# Patient Record
Sex: Female | Born: 1995 | Race: White | Hispanic: No | Marital: Single | Smoking: Former smoker
Health system: Southern US, Community
[De-identification: ages and names within clinical notes are randomized; demographics above are authoritative.]

## PROBLEM LIST (undated history)

## (undated) DIAGNOSIS — Z8669 Personal history of other diseases of the nervous system and sense organs: Secondary | ICD-10-CM

## (undated) DIAGNOSIS — R519 Headache, unspecified: Secondary | ICD-10-CM

## (undated) DIAGNOSIS — Z8619 Personal history of other infectious and parasitic diseases: Secondary | ICD-10-CM

## (undated) DIAGNOSIS — J101 Influenza due to other identified influenza virus with other respiratory manifestations: Secondary | ICD-10-CM

## (undated) DIAGNOSIS — K219 Gastro-esophageal reflux disease without esophagitis: Secondary | ICD-10-CM

## (undated) DIAGNOSIS — R51 Headache: Secondary | ICD-10-CM

## (undated) DIAGNOSIS — F418 Other specified anxiety disorders: Secondary | ICD-10-CM

## (undated) HISTORY — DX: Headache: R51

## (undated) HISTORY — DX: Personal history of other infectious and parasitic diseases: Z86.19

## (undated) HISTORY — DX: Other specified anxiety disorders: F41.8

## (undated) HISTORY — DX: Personal history of other diseases of the nervous system and sense organs: Z86.69

## (undated) HISTORY — DX: Headache, unspecified: R51.9

## (undated) HISTORY — DX: Gastro-esophageal reflux disease without esophagitis: K21.9

---
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
## 2014-03-26 ENCOUNTER — Telehealth: Payer: Self-pay | Admitting: Family Medicine

## 2014-03-26 NOTE — Telephone Encounter (Signed)
Patient Information:  Caller Name: Dahlia ClientHannah  Phone: [phone]  Patient: Shannon Mcdowell, Shannon Mcdowell  Gender: Female  DOB: 15-Jun-1995  Age: [age] Years  PCP: Eustaquio BoydenGutierrez, Javier [facility])  Pregnant: No  Office Follow Up:  Does the office need to follow up with this patient?: No  Instructions For The Office: N/A   Symptoms  Reason For Call & Symptoms: Pt reports she has had a headache x1 week and getting worse the last week.  Reviewed Health History In EMR: Yes  Reviewed Medications In EMR: Yes  Reviewed Allergies In EMR: Yes  Reviewed Surgeries / Procedures: Yes  Date of Onset of Symptoms: 03/20/2014 OB / GYN:  LMP: 02/25/2014  Guideline(s) Used:  Headache  Sinus Pain and Congestion  Disposition Per Guideline:   See Today or Tomorrow in Office  Reason For Disposition Reached:   Sinus congestion (pressure, fullness) present > 10 days  Advice Given:  Reassurance:   Sinus congestion is a normal part of a cold.  Usually home treatment with nasal washes can prevent an actual bacterial sinus infection.  Antibiotics are not helpful for the sinus congestion that occurs with colds.  Here is some care advice that should help.  For a Stuffy Nose - Use Nasal Washes:  Introduction: Saline (salt water) nasal irrigation (nasal wash) is an effective and simple home remedy for treating stuffy nose and sinus congestion. The nose can be irrigated by pouring, spraying, or squirting salt water into the nose and then letting it run back out.  How it Helps: The salt water rinses out excess mucus, washes out any irritants (dust, allergens) that might be present, and moistens the nasal cavity.  Methods: There are several ways to perform nasal irrigation. You can use a saline nasal spray bottle (available over-the-counter), a rubber ear syringe, a medical syringe without the needle, or a Neti Pot.  Nasal Decongestants for a Very Stuffy or Runny Nose:  If you have a very stuffy nose, nasal decongestant  medicines can shrink the swollen nasal mucosa and allow for easier breathing. If you have a very runny nose, these medicines can reduce the amount of drainage. They may be taken as pills by mouth or as a nasal spray.  Most people do Not need to use these medicines. If your nose feels blocked, you should try using nasal washes first.  Pseudoephedrine (Sudafed) is available OTC in pill form. Typical adult dosage is two 30 mg tablets every 6 hours.  Phenylephrine (Sudafed PE) is available OTC in pill form. Typical adult dosage is two 30 mg tablets every 6 hours.  Pain and Fever Medicines:  For pain or fever relief, take either acetaminophen or ibuprofen.  Call Back If:   Sinus congestion (fullness) lasts longer than 10 days  Fever lasts longer than 3 days  You become worse.  Patient Will Follow Care Advice:  YES  Appointment Scheduled:  03/27/2014 13:15:00 Appointment Scheduled Provider:  Nicki ReaperBaity, Regina

## 2014-03-27 ENCOUNTER — Encounter: Payer: Self-pay | Admitting: Internal Medicine

## 2014-03-27 ENCOUNTER — Ambulatory Visit (INDEPENDENT_AMBULATORY_CARE_PROVIDER_SITE_OTHER): Payer: 59 | Admitting: Internal Medicine

## 2014-03-27 VITALS — BP 110/82 | HR 100 | Temp 98.4°F | Wt 108.0 lb

## 2014-03-27 DIAGNOSIS — G43909 Migraine, unspecified, not intractable, without status migrainosus: Secondary | ICD-10-CM

## 2014-03-27 MED ORDER — KETOROLAC TROMETHAMINE 30 MG/ML IJ SOLN
30.0000 mg | Freq: Once | INTRAMUSCULAR | Status: AC
  Administered 2014-03-27: 30 mg via INTRAMUSCULAR

## 2014-03-27 NOTE — Patient Instructions (Signed)

## 2014-03-27 NOTE — Progress Notes (Signed)
HPI  Pt presents to the clinic today with c/o headache and facial pressure underneath her eyes. She reports this started 1 week ago. It is located in her forehead. The pain does radiate occasionally to the back of her head. She describes it as a stabbing sensation. She has had some sensitivity to light. She denies nausea, vomiting, dizziness or blurred vision. She has tried Ibuprofen, Excedrin migraine and Aleve. She does have a history of migraines and reports that this feels like the migraines she has had in the past.  Review of Systems    Past Medical History  Diagnosis Date  . Depression with anxiety   . Generalized headaches     occasional  . GERD (gastroesophageal reflux disease)   . Hx of migraines   . History of chicken pox     Family History  Problem Relation Age of Onset  . Cancer Paternal Grandfather     leukemia  . Cancer Paternal Grandmother     breast  . Hypertension Father   . CAD Other     mat great grandmother  . Diabetes Maternal Grandfather   . Stroke Neg Hx   . Mental illness Maternal Grandfather     suicide (shot himself)    History   Social History  . Marital Status: Single    Spouse Name: N/A    Number of Children: N/A  . Years of Education: N/A   Occupational History  . Not on file.   Social History Main Topics  . Smoking status: Never Smoker   . Smokeless tobacco: Never Used  . Alcohol Use: No  . Drug Use: No     Comment: h/o MJ  . Sexual Activity:    Partners: Female   Other Topics Concern  . Not on file   Social History Narrative   Caffeine: lots of mountain dew/pepsi   Lives with father alternating with mother.     Older brother.   Occupation: [employer]   Activity: no regular exercise   Diet: good water, fruits/vegetables daily    No Known Allergies   Constitutional: Positive headache. Denies fatigue, fever or abrupt weight changes.  HEENT:  Positive facial pain. Denies eye redness, ear pain, ringing in the ears, wax buildup,  nasal congestion, runny nose, bloody nose and sore throat. Respiratory: Denies cough, difficulty breathing or shortness of breath.  Cardiovascular: Denies chest pain, chest tightness, palpitations or swelling in the hands or feet.   No other specific complaints in a complete review of systems (except as listed in HPI above).  Objective:  BP 110/82 mmHg  Pulse 100  Temp(Src) 98.4 F (36.9 C) (Oral)  Wt 108 lb (48.988 kg)  SpO2 99%  LMP 02/25/2014   General: Appears her stated age, well developed, well nourished in NAD. HEENT: Head: normal shape and size, no sinus tenderness noted; Eyes: sclera white, no icterus, conjunctiva pink, PERRLA and EOMs intact; Ears: Tm's gray and intact, normal light reflex;  Cardiovascular: Tachycardic with normal rhythm. S1,S2 noted.  No murmur, rubs or gallops noted.  Pulmonary/Chest: Normal effort and positive vesicular breath sounds. No respiratory distress. No wheezes, rales or ronchi noted.  Neuro: Alert and oriented. Coordination normal.    Assessment & Plan:   Migraine headache:  Will give Toradol 30 mg IM today- her mom is here to drive her home Advised her not to take Advil, Excedrin and Aleve at the same time as these all have the same main ingredient There may also be a  component of medication overuse headache Try Excedrin migrainei n 8 hours if the toradol does not seem to help  If headache persist or worsen, please follow up with PCP  RTC as needed or if symptoms persist.

## 2014-03-27 NOTE — Addendum Note (Signed)
Addended by: Roena MaladyEVONTENNO, Braidyn Scorsone Y on: 03/27/2014 04:34 PM   Modules accepted: Orders

## 2014-03-27 NOTE — Progress Notes (Signed)
Pre visit review using our clinic review tool, if applicable. No additional management support is needed unless otherwise documented below in the visit note. 

## 2014-04-03 ENCOUNTER — Encounter: Payer: Self-pay | Admitting: Family Medicine

## 2014-04-03 ENCOUNTER — Ambulatory Visit (INDEPENDENT_AMBULATORY_CARE_PROVIDER_SITE_OTHER): Payer: PRIVATE HEALTH INSURANCE | Admitting: Family Medicine

## 2014-04-03 VITALS — BP 110/70 | HR 88 | Temp 98.4°F | Wt 110.2 lb

## 2014-04-03 DIAGNOSIS — G43019 Migraine without aura, intractable, without status migrainosus: Secondary | ICD-10-CM

## 2014-04-03 MED ORDER — AMITRIPTYLINE HCL 25 MG PO TABS: 25.0000 mg | ORAL_TABLET | Freq: Every day | ORAL | Status: DC

## 2014-04-03 MED ORDER — CYCLOBENZAPRINE HCL 5 MG PO TABS: 5.0000 mg | ORAL_TABLET | Freq: Two times a day (BID) | ORAL | Status: DC | PRN

## 2014-04-03 NOTE — Patient Instructions (Signed)
I think you have persistent migraine headache. Neurological exam normal today. Treat migraine with flexeril 5mg  (muscle relaxant) as needed for migraine headache (as needed only), and start daily amitriptyline 25mg  at bedtime to help prevent migraines (daily medicine). Back off aleve, aspirin, advil, excedrin use for now while we try above medications. Update me next week with effect of above medicines.

## 2014-04-03 NOTE — Progress Notes (Signed)
BP 110/70 mmHg  Pulse 88  Temp(Src) 98.4 F (36.9 C) (Oral)  Wt 110 lb 4 oz (50.009 kg)  LMP 02/25/2014   CC: headache  Subjective:    Patient ID: Shannon Mcdowell, female    DOB: 07-20-1995, 18 y.o.   MRN: [id number]  HPI: Shannon GlasgowHannah L Seltzer is a 18 y.o. female presenting on 04/03/2014 for Headache   Known h/o migraines, recently worsening over last 2 weeks. These feel different because they do not improve with advil. Persistently worsening as day progresses, start when she awakens, continue until she goes to sleep. Unilateral but alternating sides. Describes throbbing and stabbing, +photo/phonophobia. No nausea with them. Activity limiting, better when she lays down in a quiet/dark environment. Rare episodes of dizziness and slurring of speech with migraines.   Seen here 03/27/14 with dx migraine headache - treated with Toradol IM 30mg  with no improvement. ?MOH contributing. Also suggested excedrin pill. Taking 3-4 ibuprofen several times a day.   She has tried advil, Armond Hangaleve, excedrin migraine without improvement.  Increase in anxiety because of worsening headaches.   Currently 7/10 pain.  Sleeping 6-8 hrs/day. Appetite good. Has significantly decreased caffeine intake, drinks mainly water now. Stress level stable - goes to school and works full time. 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    Relevant past medical, surgical, family and social history reviewed and updated as indicated.  Allergies and medications reviewed and updated. No current outpatient prescriptions on file prior to visit.   No current facility-administered medications on file prior to visit.   Past Medical History  Diagnosis Date  . Depression with anxiety   . Generalized headaches     occasional  . GERD (gastroesophageal reflux disease)   . Hx of migraines   . History of chicken pox     Review of Systems Per HPI unless specifically indicated above    Objective:    BP 110/70 mmHg   Pulse 88  Temp(Src) 98.4 F (36.9 C) (Oral)  Wt 110 lb 4 oz (50.009 kg)  LMP 02/25/2014  Physical Exam  Constitutional: She appears well-developed and well-nourished. No distress.  NAD  HENT:  Head: Normocephalic and atraumatic.  Mouth/Throat: Oropharynx is clear and moist. No oropharyngeal exudate.  Eyes: Conjunctivae and EOM are normal. Pupils are equal, round, and reactive to light. No scleral icterus.  Neck: Normal range of motion. Neck supple.  Cardiovascular: Normal rate, regular rhythm, normal heart sounds and intact distal pulses.   No murmur heard. Pulmonary/Chest: Effort normal and breath sounds normal. No respiratory distress. She has no wheezes. She has no rales.  Lymphadenopathy:    She has no cervical adenopathy.  Neurological: She has normal strength. No cranial nerve deficit or sensory deficit. She displays a negative Romberg sign. Coordination and gait normal.  CN 2-12 intact Station and gait intact Intact FTN, no pronator drift  Psychiatric: She has a normal mood and affect.  Nursing note and vitals reviewed.      Assessment & Plan:   Problem List Items Addressed This Visit    Migraine headache - Primary    Describes migraine headache that is worsening over last 2 weeks due to unknown reason. ?MOH given significant NSAID use. rec start amitriptyline 25mg  nightly ppx along with flexeril 5mg  prn HA for abortive therapy. Discussed sedation precautions for both meds. Discussed headache diary - handout provided for pt to drop back off to review. Update next week with effect of above treatment. Also discussed  avoiding migraine triggers and how stress/anxiety may play role in making her more susceptible to headaches. Pt agrees with plan.    Relevant Medications      amitriptyline (ELAVIL) tablet      cyclobenzaprine (FLEXERIL) tablet       Follow up plan: Return if symptoms worsen or fail to improve.

## 2014-04-03 NOTE — Progress Notes (Signed)
Pre visit review using our clinic review tool, if applicable. No additional management support is needed unless otherwise documented below in the visit note. 

## 2014-04-03 NOTE — Assessment & Plan Note (Addendum)
Describes migraine headache that is worsening over last 2 weeks due to unknown reason. ?MOH given significant NSAID use. rec start amitriptyline 25mg  nightly ppx along with flexeril 5mg  prn HA for abortive therapy. Discussed sedation precautions for both meds. Discussed headache diary - handout provided for pt to drop back off to review. Update next week with effect of above treatment. Also discussed avoiding migraine triggers and how stress/anxiety may play role in making her more susceptible to headaches. Pt agrees with plan.

## 2014-07-24 ENCOUNTER — Ambulatory Visit (INDEPENDENT_AMBULATORY_CARE_PROVIDER_SITE_OTHER): Payer: Managed Care, Other (non HMO) | Admitting: Family Medicine

## 2014-07-24 ENCOUNTER — Encounter: Payer: Self-pay | Admitting: Family Medicine

## 2014-07-24 VITALS — BP 114/68 | HR 88 | Temp 98.1°F | Ht 64.0 in | Wt 115.2 lb

## 2014-07-24 DIAGNOSIS — Z0000 Encounter for general adult medical examination without abnormal findings: Secondary | ICD-10-CM | POA: Insufficient documentation

## 2014-07-24 DIAGNOSIS — N898 Other specified noninflammatory disorders of vagina: Secondary | ICD-10-CM | POA: Diagnosis not present

## 2014-07-24 DIAGNOSIS — Z113 Encounter for screening for infections with a predominantly sexual mode of transmission: Secondary | ICD-10-CM

## 2014-07-24 DIAGNOSIS — G43019 Migraine without aura, intractable, without status migrainosus: Secondary | ICD-10-CM

## 2014-07-24 NOTE — Patient Instructions (Signed)
Blood work today. Wet prep today. We will call you with results. Return as needed or in 1 year for next physical.  Health Maintenance Adopting a healthy lifestyle and getting preventive care can go a long way to promote health and wellness. Talk with your health care provider about what schedule of regular examinations is right for you. This is a good chance for you to check in with your provider about disease prevention and staying healthy. In between checkups, there are plenty of things you can do on your own. Experts have done a lot of research about which lifestyle changes and preventive measures are most likely to keep you healthy. Ask your health care provider for more information. WEIGHT AND DIET  Eat a healthy diet  Be sure to include plenty of vegetables, fruits, low-fat dairy products, and lean protein.  Do not eat a lot of foods high in solid fats, added sugars, or salt.  Get regular exercise. This is one of the most important things you can do for your health.  Most adults should exercise for at least 150 minutes each week. The exercise should increase your heart rate and make you sweat (moderate-intensity exercise).  Most adults should also do strengthening exercises at least twice a week. This is in addition to the moderate-intensity exercise.  Maintain a healthy weight  Body mass index (BMI) is a measurement that can be used to identify possible weight problems. It estimates body fat based on height and weight. Your health care provider can help determine your BMI and help you achieve or maintain a healthy weight.  For females 1 years of age and older:   A BMI below 18.5 is considered underweight.  A BMI of 18.5 to 24.9 is normal.  A BMI of 25 to 29.9 is considered overweight.  A BMI of 30 and above is considered obese.  Watch levels of cholesterol and blood lipids  You should start having your blood tested for lipids and cholesterol at 20 years of age, then have  this test every 5 years.  You may need to have your cholesterol levels checked more often if:  Your lipid or cholesterol levels are high.  You are older than 20 years of age.  You are at high risk for heart disease.  CANCER SCREENING   Lung Cancer  Lung cancer screening is recommended for adults 11-67 years old who are at high risk for lung cancer because of a history of smoking.  A yearly low-dose CT scan of the lungs is recommended for people who:  Currently smoke.  Have quit within the past 15 years.  Have at least a 30-pack-year history of smoking. A pack year is smoking an average of one pack of cigarettes a day for 1 year.  Yearly screening should continue until it has been 15 years since you quit.  Yearly screening should stop if you develop a health problem that would prevent you from having lung cancer treatment.  Breast Cancer  Practice breast self-awareness. This means understanding how your breasts normally appear and feel.  It also means doing regular breast self-exams. Let your health care provider know about any changes, no matter how small.  If you are in your 20s or 30s, you should have a clinical breast exam (CBE) by a health care provider every 1-3 years as part of a regular health exam.  If you are 87 or older, have a CBE every year. Also consider having a breast X-ray (mammogram) every year.  If you have a family history of breast cancer, talk to your health care provider about genetic screening.  If you are at high risk for breast cancer, talk to your health care provider about having an MRI and a mammogram every year.  Breast cancer gene (BRCA) assessment is recommended for women who have family members with BRCA-related cancers. BRCA-related cancers include:  Breast.  Ovarian.  Tubal.  Peritoneal cancers.  Results of the assessment will determine the need for genetic counseling and BRCA1 and BRCA2 testing. Cervical Cancer Routine pelvic  examinations to screen for cervical cancer are no longer recommended for nonpregnant women who are considered low risk for cancer of the pelvic organs (ovaries, uterus, and vagina) and who do not have symptoms. A pelvic examination may be necessary if you have symptoms including those associated with pelvic infections. Ask your health care provider if a screening pelvic exam is right for you.   The Pap test is the screening test for cervical cancer for women who are considered at risk.  If you had a hysterectomy for a problem that was not cancer or a condition that could lead to cancer, then you no longer need Pap tests.  If you are older than 65 years, and you have had normal Pap tests for the past 10 years, you no longer need to have Pap tests.  If you have had past treatment for cervical cancer or a condition that could lead to cancer, you need Pap tests and screening for cancer for at least 20 years after your treatment.  If you no longer get a Pap test, assess your risk factors if they change (such as having a new sexual partner). This can affect whether you should start being screened again.  Some women have medical problems that increase their chance of getting cervical cancer. If this is the case for you, your health care provider may recommend more frequent screening and Pap tests.  The human papillomavirus (HPV) test is another test that may be used for cervical cancer screening. The HPV test looks for the virus that can cause cell changes in the cervix. The cells collected during the Pap test can be tested for HPV.  The HPV test can be used to screen women 64 years of age and older. Getting tested for HPV can extend the interval between normal Pap tests from three to five years.  An HPV test also should be used to screen women of any age who have unclear Pap test results.  After 20 years of age, women should have HPV testing as often as Pap tests.  Colorectal Cancer  This type of  cancer can be detected and often prevented.  Routine colorectal cancer screening usually begins at 20 years of age and continues through 20 years of age.  Your health care provider may recommend screening at an earlier age if you have risk factors for colon cancer.  Your health care provider may also recommend using home test kits to check for hidden blood in the stool.  A small camera at the end of a tube can be used to examine your colon directly (sigmoidoscopy or colonoscopy). This is done to check for the earliest forms of colorectal cancer.  Routine screening usually begins at age 48.  Direct examination of the colon should be repeated every 5-10 years through 20 years of age. However, you may need to be screened more often if early forms of precancerous polyps or small growths are found. Skin Cancer  Check your skin from head to toe regularly.  Tell your health care provider about any new moles or changes in moles, especially if there is a change in a mole's shape or color.  Also tell your health care provider if you have a mole that is larger than the size of a pencil eraser.  Always use sunscreen. Apply sunscreen liberally and repeatedly throughout the day.  Protect yourself by wearing long sleeves, pants, a wide-brimmed hat, and sunglasses whenever you are outside. HEART DISEASE, DIABETES, AND HIGH BLOOD PRESSURE   Have your blood pressure checked at least every 1-2 years. High blood pressure causes heart disease and increases the risk of stroke.  If you are between 50 years and 48 years old, ask your health care provider if you should take aspirin to prevent strokes.  Have regular diabetes screenings. This involves taking a blood sample to check your fasting blood sugar level.  If you are at a normal weight and have a low risk for diabetes, have this test once every three years after 20 years of age.  If you are overweight and have a high risk for diabetes, consider being  tested at a younger age or more often. PREVENTING INFECTION  Hepatitis B  If you have a higher risk for hepatitis B, you should be screened for this virus. You are considered at high risk for hepatitis B if:  You were born in a country where hepatitis B is common. Ask your health care provider which countries are considered high risk.  Your parents were born in a high-risk country, and you have not been immunized against hepatitis B (hepatitis B vaccine).  You have HIV or AIDS.  You use needles to inject street drugs.  You live with someone who has hepatitis B.  You have had sex with someone who has hepatitis B.  You get hemodialysis treatment.  You take certain medicines for conditions, including cancer, organ transplantation, and autoimmune conditions. Hepatitis C  Blood testing is recommended for:  Everyone born from [birth year] through 1966.  Anyone with known risk factors for hepatitis C. Sexually transmitted infections (STIs)  You should be screened for sexually transmitted infections (STIs) including gonorrhea and chlamydia if:  You are sexually active and are younger than 20 years of age.  You are older than 20 years of age and your health care provider tells you that you are at risk for this type of infection.  Your sexual activity has changed since you were last screened and you are at an increased risk for chlamydia or gonorrhea. Ask your health care provider if you are at risk.  If you do not have HIV, but are at risk, it may be recommended that you take a prescription medicine daily to prevent HIV infection. This is called pre-exposure prophylaxis (PrEP). You are considered at risk if:  You are sexually active and do not regularly use condoms or know the HIV status of your partner(s).  You take drugs by injection.  You are sexually active with a partner who has HIV. Talk with your health care provider about whether you are at high risk of being infected with HIV. If  you choose to begin PrEP, you should first be tested for HIV. You should then be tested every 3 months for as long as you are taking PrEP.  PREGNANCY   If you are premenopausal and you may become pregnant, ask your health care provider about preconception counseling.  If you may become pregnant,  take 400 to 800 micrograms (mcg) of folic acid every day.  If you want to prevent pregnancy, talk to your health care provider about birth control (contraception). OSTEOPOROSIS AND MENOPAUSE   Osteoporosis is a disease in which the bones lose minerals and strength with aging. This can result in serious bone fractures. Your risk for osteoporosis can be identified using a bone density scan.  If you are 10 years of age or older, or if you are at risk for osteoporosis and fractures, ask your health care provider if you should be screened.  Ask your health care provider whether you should take a calcium or vitamin D supplement to lower your risk for osteoporosis.  Menopause may have certain physical symptoms and risks.  Hormone replacement therapy may reduce some of these symptoms and risks. Talk to your health care provider about whether hormone replacement therapy is right for you.  HOME CARE INSTRUCTIONS   Schedule regular health, dental, and eye exams.  Stay current with your immunizations.   Do not use any tobacco products including cigarettes, chewing tobacco, or electronic cigarettes.  If you are pregnant, do not drink alcohol.  If you are breastfeeding, limit how much and how often you drink alcohol.  Limit alcohol intake to no more than 1 drink per day for nonpregnant women. One drink equals 12 ounces of beer, 5 ounces of wine, or 1 ounces of hard liquor.  Do not use street drugs.  Do not share needles.  Ask your health care provider for help if you need support or information about quitting drugs.  Tell your health care provider if you often feel depressed.  Tell your health  care provider if you have ever been abused or do not feel safe at home. Document Released: 11/15/2010 Document Revised: 09/16/2013 Document Reviewed: 04/03/2013 [facility] Patient Information 2015 [place], [place]. This information is not intended to replace advice given to you by your health care provider. Make sure you discuss any questions you have with your health care provider.

## 2014-07-24 NOTE — Assessment & Plan Note (Signed)
Preventative protocols reviewed and updated unless pt declined. Discussed healthy diet and lifestyle.  

## 2014-07-24 NOTE — Progress Notes (Signed)
Pre visit review using our clinic review tool, if applicable. No additional management support is needed unless otherwise documented below in the visit note. 

## 2014-07-24 NOTE — Assessment & Plan Note (Signed)
Wet prep sent today. Anticipate BV based on discharge present today. STD screen today as well per pt request.

## 2014-07-24 NOTE — Assessment & Plan Note (Signed)
Stable on amitriptyline 25mg  nightly and flexeril abortively. Continue.

## 2014-07-24 NOTE — Progress Notes (Signed)
BP 114/68 mmHg  Pulse 88  Temp(Src) 98.1 F (36.7 C) (Oral)  Ht  (1.626 m)  Wt 115 lb 4 oz (52.277 kg)  BMI 19.77 kg/m2  LMP 07/11/2014   CC: CPE  Subjective:    Patient ID: Shannon Mcdowell, female    DOB: 1996/04/14, 19 y.o.   MRN: [id number]  HPI: Shannon Mcdowell is a 19 y.o. female presenting on 07/24/2014 for Annual Exam   Migraine - on amitriptyline  nightly. Occasional breakthrough headaches.   Occasional tremors.  Preventative: Completed HPV series.  Reviewed immunizations in chart.  Thinks Tdap around 2008. Well woman - saw [facility] 2013. Was on birth control, then stopped. Sexually active with women. 3 partners in last year.   Caffeine: lots of mountain dew/pepsi Lives with father alternating with mother. They are separated. Dogs at home.  Has an older brother. Edu: [employer] studying criminal justice Occupation: Airline pilot, Art gallery manager Activity: no regular exercise  Diet: good water, fruits/vegetables daily, significant Dr Shannon Mcdowell and sweet tea.  Relevant past medical, surgical, family and social history reviewed and updated as indicated. Interim medical history since our last visit reviewed. Allergies and medications reviewed and updated. Current Outpatient Prescriptions on File Prior to Visit  Medication Sig  . amitriptyline (ELAVIL) 25 MG tablet Take 1 tablet (25 mg total) by mouth at bedtime. For migraine prevention  . cyclobenzaprine (FLEXERIL) 5 MG tablet Take 1 tablet (5 mg total) by mouth 2 (two) times daily as needed (migraine headache - take at onset).   No current facility-administered medications on file prior to visit.   Past Medical History  Diagnosis Date  . Depression with anxiety   . Generalized headaches     occasional  . GERD (gastroesophageal reflux disease)   . Hx of migraines   . History of chicken pox    No past surgical history on file.   Review of Systems  Constitutional: Negative for fever, chills, activity change,  appetite change, fatigue and unexpected weight change.  HENT: Negative for hearing loss.   Eyes: Negative for visual disturbance.  Respiratory: Negative for cough, chest tightness, shortness of breath and wheezing.   Cardiovascular: Negative for chest pain, palpitations and leg swelling.  Gastrointestinal: Negative for nausea, vomiting, abdominal pain, diarrhea, constipation, blood in stool and abdominal distention.  Genitourinary: Negative for hematuria and difficulty urinating.  Musculoskeletal: Negative for myalgias, arthralgias and neck pain.  Skin: Negative for rash.  Neurological: Negative for dizziness, seizures, syncope and headaches.  Hematological: Negative for adenopathy. Does not bruise/bleed easily.  Psychiatric/Behavioral: Negative for dysphoric mood. The patient is not nervous/anxious.    Per HPI unless specifically indicated above     Objective:    BP 114/68 mmHg  Pulse 88  Temp(Src) 98.1 F (36.7 C) (Oral)  Ht  (1.626 m)  Wt 115 lb 4 oz (52.277 kg)  BMI 19.77 kg/m2  LMP 07/11/2014  Wt Readings from Last 3 Encounters:  07/24/14 115 lb 4 oz (52.277 kg) (25 %*, Z = -0.69)  04/03/14 110 lb 4 oz (50.009 kg) (16 %*, Z = -0.99)  03/27/14 108 lb (48.988 kg) (13 %*, Z = -1.15)   * Growth percentiles are based on CDC 2-20 Years data.    Physical Exam  Constitutional: She is oriented to person, place, and time. She appears well-developed and well-nourished. No distress.  HENT:  Head: Normocephalic and atraumatic.  Right Ear: Hearing, tympanic membrane, external ear and ear canal normal.  Left  Ear: Hearing, tympanic membrane, external ear and ear canal normal.  Nose: Nose normal.  Mouth/Throat: Uvula is midline, oropharynx is clear and moist and mucous membranes are normal. No oropharyngeal exudate, posterior oropharyngeal edema or posterior oropharyngeal erythema.  Eyes: Conjunctivae and EOM are normal. Pupils are equal, round, and reactive to light. No scleral  icterus.  Neck: Normal range of motion. Neck supple. No thyromegaly present.  Cardiovascular: Normal rate, regular rhythm, normal heart sounds and intact distal pulses.   No murmur heard. Pulses:      Radial pulses are 2+ on the right side, and 2+ on the left side.  Pulmonary/Chest: Effort normal and breath sounds normal. No respiratory distress. She has no wheezes. She has no rales.  Abdominal: Soft. Bowel sounds are normal. She exhibits no distension and no mass. There is no tenderness. There is no rebound and no guarding.  Genitourinary: Uterus normal. Pelvic exam was performed with patient supine. There is no rash, tenderness, lesion or injury on the right labia. There is no rash, tenderness, lesion or injury on the left labia. Cervix exhibits no motion tenderness, no discharge and no friability. Right adnexum displays no mass, no tenderness and no fullness. Left adnexum displays no mass, no tenderness and no fullness. Vaginal discharge found.  Vaginal discharge present with odor present  Musculoskeletal: Normal range of motion. She exhibits no edema.  Lymphadenopathy:    She has no cervical adenopathy.  Neurological: She is alert and oriented to person, place, and time.  CN grossly intact, station and gait intact  Skin: Skin is warm and dry. No rash noted.  Psychiatric: She has a normal mood and affect. Her behavior is normal. Judgment and thought content normal.  Nursing note and vitals reviewed.  Results for orders placed or performed in visit on 08/28/13  GC/chlamydia probe amp, urine  Result Value Ref Range   Chlamydia, Swab/Urine, PCR NEGATIVE NEGATIVE   GC Probe Amp, Urine NEGATIVE NEGATIVE  HIV antibody  Result Value Ref Range   HIV 1&2 Ab, 4th Generation NONREACTIVE NONREACTIVE  RPR  Result Value Ref Range   RPR Ser Ql NON REAC NON REAC      Assessment & Plan:   Problem List Items Addressed This Visit    Vaginal discharge    Wet prep sent today. Anticipate BV based on  discharge present today. STD screen today as well per pt request.      Relevant Orders   GC/Chlamydia Probe Amp   WET PREP BY MOLECULAR PROBE   Migraine headache    Stable on amitriptyline 25mg  nightly and flexeril abortively. Continue.      Health maintenance examination - Primary    Preventative protocols reviewed and updated unless pt declined. Discussed healthy diet and lifestyle.       Relevant Orders   Basic metabolic panel    Other Visit Diagnoses    Screen for STD (sexually transmitted disease)        Relevant Orders    RPR    GC/Chlamydia Probe Amp    HIV antibody        Follow up plan: Return as needed, for annual exam, prior fasting for blood work.

## 2014-07-25 ENCOUNTER — Other Ambulatory Visit: Payer: Self-pay | Admitting: Family Medicine

## 2014-07-25 LAB — BASIC METABOLIC PANEL
BUN: 14 mg/dL (ref 6–23)
CALCIUM: 9.9 mg/dL (ref 8.4–10.5)
CO2: 28 mEq/L (ref 19–32)
CREATININE: 0.83 mg/dL (ref 0.40–1.20)
Chloride: 104 mEq/L (ref 96–112)
GFR: 93.36 mL/min (ref >60.00)
Glucose, Bld: 86 mg/dL (ref 70–99)
Potassium: 4.1 mEq/L (ref 3.5–5.1)
Sodium: 138 mEq/L (ref 135–145)

## 2014-07-25 LAB — WET PREP BY MOLECULAR PROBE
Candida species: NEGATIVE
Gardnerella vaginalis: POSITIVE — AB
TRICHOMONAS VAG: POSITIVE — AB

## 2014-07-25 LAB — RPR

## 2014-07-25 LAB — GC/CHLAMYDIA PROBE AMP
CT Probe RNA: NEGATIVE
GC Probe RNA: NEGATIVE

## 2014-07-25 LAB — HIV ANTIBODY (ROUTINE TESTING W REFLEX): HIV 1&2 Ab, 4th Generation: NONREACTIVE

## 2014-07-25 MED ORDER — METRONIDAZOLE 500 MG PO TABS: 500.0000 mg | ORAL_TABLET | Freq: Two times a day (BID) | ORAL | Status: DC

## 2014-07-28 ENCOUNTER — Telehealth: Payer: Self-pay

## 2014-07-28 NOTE — Telephone Encounter (Signed)
Make sure not drinking any EtOH as it will make her sick. Recommend finish abx course. Would have her take in applesauce or other food. This is best med for trichomonas.

## 2014-07-28 NOTE — Telephone Encounter (Signed)
Spoke with patient and confirmed she is not drinking alcohol. She is going to finish abx as she has found that taking them with milk helps.

## 2014-07-28 NOTE — Telephone Encounter (Signed)
Pt left v/m requesting cb; pt request to change abx (flagyl ?) due to taste of abx. Please advise.[facility].

## 2014-08-16 ENCOUNTER — Other Ambulatory Visit: Payer: Self-pay | Admitting: Family Medicine

## 2014-08-27 ENCOUNTER — Telehealth: Payer: Self-pay

## 2014-08-27 DIAGNOSIS — G43019 Migraine without aura, intractable, without status migrainosus: Secondary | ICD-10-CM

## 2014-08-27 DIAGNOSIS — R51 Headache: Secondary | ICD-10-CM

## 2014-08-27 DIAGNOSIS — R519 Headache, unspecified: Secondary | ICD-10-CM

## 2014-08-27 NOTE — Telephone Encounter (Signed)
Pt left v/m; pt was seen 07/24/14 for annual exam; h/a continues to hurt and today pt has blurred vision. Pt request referral to neurologist.Please advise.

## 2014-08-29 NOTE — Telephone Encounter (Signed)
Will refer to headache center for persistent headaches.

## 2014-09-01 ENCOUNTER — Telehealth: Payer: Self-pay | Admitting: Family Medicine

## 2014-09-01 NOTE — Telephone Encounter (Signed)
referral placed last week

## 2014-09-01 NOTE — Telephone Encounter (Signed)
Pt is requesting a referral to a neurologist.  Mom states that shots and medications for headaches are not helping. Call back [phone]

## 2014-09-02 NOTE — Telephone Encounter (Signed)
LVM on home and cell #'s. Also called [phone]; phone just kept ringing.

## 2014-09-24 ENCOUNTER — Telehealth: Payer: Self-pay | Admitting: Family Medicine

## 2014-09-24 NOTE — Telephone Encounter (Signed)
Pt has appt with Nicki Reaperegina Baity NP 09/25/14 at 1:15 PM.

## 2014-09-24 NOTE — Telephone Encounter (Signed)
[facility] Client TELEPHONE ADVICE RECORD [facility]  Patient Name: Shannon Mcdowell ClientHANNAH Behe  DOB: 04-21-1996    Initial Comment Caller states she finished her medication, she now has a discharge, only when she urinates.   Nurse Assessment  Nurse: Roderic [place], RN, Amy Date/Time Lamount Cohen(Eastern Time): 09/24/2014 10:24:14 AM  Confirm and document reason for call. If symptomatic, describe symptoms. ---CALLER STATES THAT SHE WAS TAKING A MEDICATION FOR BACTERIAL VAGINOSIS. SHE HAS FINISHED HER MEDS NOW AND SHE IS ONLY HAVING A DISCHARGE WHEN SHE URINATES. THE DISCHARGE IS WHITE. WHEN SHE IS URINATING SHE IS HAVING WHITE, CHUNKY SECRETIONS COMING OUT. SHE WAS TESTED FOR THIS LAST MONTH. SHE WAS TO TAKE 7 DAYS OF THE MEDICATIONS.  Has the patient traveled out of the country within the last 30 days? ---Not Applicable  Does the patient require triage? ---Yes  Related visit to physician within the last 2 weeks? ---No  Does the PT have any chronic conditions? (i.e. diabetes, asthma, etc.) ---No  Did the patient indicate they were pregnant? ---No     Guidelines    Guideline Title Affirmed Question Affirmed Notes  Vaginal Discharge [1] Mild lower abdominal pain comes and goes (cramps) AND [2] lasts > 24 hours    Final Disposition User   See Physician within 24 Hours [place], RN, Amy    Comments  permission was given to leave appt information on her phone. called and left the information on phone that she has an appt at 1030 on thursday.

## 2014-09-25 ENCOUNTER — Ambulatory Visit: Payer: Self-pay | Admitting: Internal Medicine

## 2014-09-25 ENCOUNTER — Encounter: Payer: Self-pay | Admitting: Internal Medicine

## 2014-09-25 ENCOUNTER — Ambulatory Visit (INDEPENDENT_AMBULATORY_CARE_PROVIDER_SITE_OTHER): Payer: Managed Care, Other (non HMO) | Admitting: Internal Medicine

## 2014-09-25 VITALS — BP 122/78 | HR 85 | Temp 98.0°F | Wt 110.5 lb

## 2014-09-25 DIAGNOSIS — B373 Candidiasis of vulva and vagina: Secondary | ICD-10-CM | POA: Diagnosis not present

## 2014-09-25 DIAGNOSIS — B3731 Acute candidiasis of vulva and vagina: Secondary | ICD-10-CM

## 2014-09-25 MED ORDER — FLUCONAZOLE 150 MG PO TABS: 150.0000 mg | ORAL_TABLET | Freq: Once | ORAL | Status: DC

## 2014-09-25 NOTE — Progress Notes (Signed)
Pre visit review using our clinic review tool, if applicable. No additional management support is needed unless otherwise documented below in the visit note. 

## 2014-09-25 NOTE — Progress Notes (Signed)
Subjective:    Patient ID: Shannon Mcdowell, female    DOB: 23-May-1995, 19 y.o.   MRN: [id number]  HPI  Pt presents to the clinic today with c/o vaginal discharge. She reports she was seen 1 month ago for the same. She was diagnosed with Bacterial Vaginosis and treated with Flagyl x 7 days. She reports her symptoms did improve but did not go away completely. She now only has vaginal discharge with urination. The discharge is thick and white. She denies vaginal itching but the outside burns a little with urination. She has had some mild abdominal cramping but denies nausea or vomiting. She also reports that she has not had a period since 06/25/2014. She is very irregular. She is not on OCP's. She is sexually active but with females only.  Review of Systems      Past Medical History  Diagnosis Date  . Depression with anxiety   . Generalized headaches     occasional  . GERD (gastroesophageal reflux disease)   . Hx of migraines   . History of chicken pox     Current Outpatient Prescriptions  Medication Sig Dispense Refill  . amitriptyline (ELAVIL) 25 MG tablet Take 1 tablet (25 mg total) by mouth at bedtime. For migraine prevention 30 tablet 3  . cyclobenzaprine (FLEXERIL) 5 MG tablet TAKE 1 TABLET BY MOUTH 2 TIMES A DAY AS NEEDED FOR ONSET OF MIGRAINE HEADACHE 30 tablet 1  . metroNIDAZOLE (FLAGYL) 500 MG tablet Take 1 tablet (500 mg total) by mouth 2 (two) times daily. 14 tablet 0   No current facility-administered medications for this visit.    No Known Allergies  Family History  Problem Relation Age of Onset  . Cancer Paternal Grandfather     leukemia  . Cancer Paternal Grandmother     breast  . Hypertension Father   . CAD Other     mat great grandmother  . Diabetes Maternal Grandfather   . Stroke Neg Hx   . Mental illness Maternal Grandfather     suicide (shot himself)    History   Social History  . Marital Status: Single    Spouse Name: N/A  . Number of  Children: N/A  . Years of Education: N/A   Occupational History  . Not on file.   Social History Main Topics  . Smoking status: Never Smoker   . Smokeless tobacco: Never Used  . Alcohol Use: No  . Drug Use: No     Comment: h/o MJ  . Sexual Activity:    Partners: Female   Other Topics Concern  . Not on file   Social History Narrative   Caffeine: lots of mountain dew/pepsi   Lives with father alternating with mother.     Older brother.   Occupation: Airline pilotsales   Activity: no regular exercise   Diet: good water, fruits/vegetables daily     Constitutional: Denies fever, malaise, fatigue, headache or abrupt weight changes.  HEENT: Denies eye pain, eye redness, ear pain, ringing in the ears, wax buildup, runny nose, nasal congestion, bloody nose, or sore throat. Respiratory: Denies difficulty breathing, shortness of breath, cough or sputum production.   Cardiovascular: Denies chest pain, chest tightness, palpitations or swelling in the hands or feet.  Gastrointestinal: Pt reports abdominal pain. Denies abdominal pain, bloating, constipation, diarrhea or blood in the stool.  GU: Pt reports vaginal discharge. Denies urgency, frequency, pain with urination, burning sensation, blood in urine, odor.  No other specific complaints in  a complete review of systems (except as listed in HPI above).   Objective:   Physical Exam   BP 122/78 mmHg  Pulse 85  Temp(Src) 98 F (36.7 C) (Oral)  Wt 110 lb 8 oz (50.122 kg)  SpO2 96% Wt Readings from Last 3 Encounters:  09/25/14 110 lb 8 oz (50.122 kg) (16 %*, Z = -1.01)  07/24/14 115 lb 4 oz (52.277 kg) (25 %*, Z = -0.69)  04/03/14 110 lb 4 oz (50.009 kg) (16 %*, Z = -0.99)   * Growth percentiles are based on CDC 2-20 Years data.    General: Appears her stated age, well developed, well nourished in NAD. Skin: Warm, dry and intact. No rashes, lesions or ulcerations noted. Cardiovascular: Normal rate and rhythm. S1,S2 noted.  No murmur, rubs  or gallops noted.  Pulmonary/Chest: Normal effort and positive vesicular breath sounds. No respiratory distress. No wheezes, rales or ronchi noted.  Abdomen: Soft and nontender. Normal bowel sounds, no bruits noted. No distention or masses noted.  Pelvic: Normal female anatomy. Slight odor noted. White discharge noted at the vaginal opening.  BMET    Component Value Date/Time   NA 138 07/24/2014 1720   NA 139 09/04/2012 2355   K 4.1 07/24/2014 1720   K 3.8 09/04/2012 2355   CL 104 07/24/2014 1720   CL 105 09/04/2012 2355   CO2 28 07/24/2014 1720   CO2 27* 09/04/2012 2355   GLUCOSE 86 07/24/2014 1720   GLUCOSE 88 09/04/2012 2355   BUN 14 07/24/2014 1720   BUN 9 09/04/2012 2355   CREATININE 0.83 07/24/2014 1720   CREATININE 0.64 09/04/2012 2355   CALCIUM 9.9 07/24/2014 1720   CALCIUM 9.2 09/04/2012 2355         Assessment & Plan:   Candida Vaginitis:  Wet prep: + yeast, no clue cells eRx for Diflucan 150 mg PO x 1  RTC as needed or if symptoms persist or worsen

## 2014-09-25 NOTE — Patient Instructions (Signed)

## 2014-09-30 ENCOUNTER — Encounter: Payer: Self-pay | Admitting: Neurology

## 2014-09-30 ENCOUNTER — Ambulatory Visit (INDEPENDENT_AMBULATORY_CARE_PROVIDER_SITE_OTHER): Payer: Managed Care, Other (non HMO) | Admitting: Neurology

## 2014-09-30 VITALS — BP 102/78 | HR 88 | Resp 20 | Ht 64.0 in | Wt 114.4 lb

## 2014-09-30 DIAGNOSIS — R251 Tremor, unspecified: Secondary | ICD-10-CM | POA: Diagnosis not present

## 2014-09-30 DIAGNOSIS — F4323 Adjustment disorder with mixed anxiety and depressed mood: Secondary | ICD-10-CM | POA: Diagnosis not present

## 2014-09-30 DIAGNOSIS — G43719 Chronic migraine without aura, intractable, without status migrainosus: Secondary | ICD-10-CM

## 2014-09-30 DIAGNOSIS — F419 Anxiety disorder, unspecified: Secondary | ICD-10-CM

## 2014-09-30 DIAGNOSIS — Z720 Tobacco use: Secondary | ICD-10-CM

## 2014-09-30 DIAGNOSIS — H538 Other visual disturbances: Secondary | ICD-10-CM

## 2014-09-30 DIAGNOSIS — Z87891 Personal history of nicotine dependence: Secondary | ICD-10-CM | POA: Insufficient documentation

## 2014-09-30 MED ORDER — AMITRIPTYLINE HCL 50 MG PO TABS: ORAL_TABLET | ORAL | Status: DC

## 2014-09-30 MED ORDER — SUMATRIPTAN SUCCINATE 100 MG PO TABS
ORAL_TABLET | ORAL | Status: DC
Start: 2014-09-30 — End: 2014-12-25

## 2014-09-30 NOTE — Patient Instructions (Addendum)
1.  Take amitriptyline 50mg  tablet.  Take 1/2 tablet at bedtime every night for 7 days, then increase to 1 full tablet at bedtime.  After starting 1 full tablet, call in 4 weeks with update and we can increase dose if needed. 2.  At earliest onset of headache, take sumatriptan 100mg  tablet.  May repeat a pill once in 2 hours if needed.  Do not exceed 2 tablets in 24 hours.  Do not take more than 2 days in a week 3.  MRI of brain with and without contrast. 4.  Follow up in 3 months.

## 2014-09-30 NOTE — Progress Notes (Signed)
NEUROLOGY CONSULTATION NOTE  Shannon Mcdowell MRN: [id number] DOB: August 23, 1995  Referring provider: Dr. Sharen HonesGutierrez Primary care provider: Dr. Sharen HonesGutierrez  Reason for consult:  headache  HISTORY OF PRESENT ILLNESS: Shannon Mcdowell is a 19 year old right-handed female with anxiety who presents for migraines.  Records reviewed.  She is accompanied by a friend who provides some history.  Onset:  Has had headaches on and off for several years.  She has had a new chronic daily headache since November 2015.  There was no preceding trigger. Location:  Variable (either side or whole head) Quality:  Feels like "somethin eating my brain" Intensity:  "11/10" Aura:  no Prodrome:  no Associated symptoms:  Photophobia, phonophobia, nausea.  More recently, she has had blurred vision.  She also has had increase in anxiety and one time noted episode of lightheadedness with slurred speech.  She notes problems with memory. Duration:  constant Frequency:  constant Triggers/exacerbating factors:  alcohol Relieving factors:  none Activity:  She is able to function  Past abortive therapy:  Advil, Aleve, Excedrin Migraine Past preventative therapy:  none  Current abortive therapy:  Cyclobenzaprine 10mg  (variable efficacy) Current preventative therapy:  amitriptyline 25mg  (effective for 2 months but then stopped so she hasn't been taking it consistency )  Caffeine:  Dr. Reino KentPepper daily Alcohol:  occasionally Smoker:  socially Diet:  Doesn't drink enough water Exercise:  Not routine Depression/stress:  Significant for anxiety Sleep hygiene:  [employer] in [employer] department and goes to school Family history of headache:  No.  No family history of aneurysms.  She also notes tremor in her hands.  PAST MEDICAL HISTORY: Past Medical History  Diagnosis Date  . Depression with anxiety   . Generalized headaches     occasional  . GERD (gastroesophageal reflux disease)   . Hx of migraines   . History of  chicken pox     PAST SURGICAL HISTORY: No past surgical history on file.  MEDICATIONS: Current Outpatient Prescriptions on File Prior to Visit  Medication Sig Dispense Refill  . cyclobenzaprine (FLEXERIL) 5 MG tablet TAKE 1 TABLET BY MOUTH 2 TIMES A DAY AS NEEDED FOR ONSET OF MIGRAINE HEADACHE 30 tablet 1  . fluconazole (DIFLUCAN) 150 MG tablet Take 1 tablet (150 mg total) by mouth once. (Patient not taking: Reported on 09/30/2014) 1 tablet 0   No current facility-administered medications on file prior to visit.    ALLERGIES: No Known Allergies  FAMILY HISTORY: Family History  Problem Relation Age of Onset  . Cancer Paternal Grandfather     leukemia  . Cancer Paternal Grandmother     breast& lung  . Hypertension Father   . CAD Other     mat great grandmother  . Diabetes Maternal Grandfather   . Stroke Neg Hx   . Mental illness Maternal Grandfather     suicide (shot himself)  . Anxiety disorder Brother     SOCIAL HISTORY: History   Social History  . Marital Status: Single    Spouse Name: N/A  . Number of Children: N/A  . Years of Education: N/A   Occupational History  . Not on file.   Social History Main Topics  . Smoking status: Former Games developermoker  . Smokeless tobacco: Never Used  . Alcohol Use: 0.0 oz/week    0 Standard drinks or equivalent per week     Comment: social   . Drug Use: Yes     Comment: h/o MJ  . Sexual Activity:  Partners: Female   Other Topics Concern  . Not on file   Social History Narrative   Caffeine: lots of mountain dew/pepsi   Lives with father alternating with mother.     Older brother.   Occupation: Airline pilotsales   Activity: no regular exercise   Diet: good water, fruits/vegetables daily    REVIEW OF SYSTEMS: Constitutional: No fevers, chills, or sweats, no generalized fatigue, change in appetite Eyes: No visual changes, double vision, eye pain Ear, nose and throat: No hearing loss, ear pain, nasal congestion, sore  throat Cardiovascular: No chest pain, palpitations Respiratory:  No shortness of breath at rest or with exertion, wheezes GastrointestinaI: No nausea, vomiting, diarrhea, abdominal pain, fecal incontinence Genitourinary:  No dysuria, urinary retention or frequency Musculoskeletal:  No neck pain, back pain Integumentary: No rash, pruritus, skin lesions Neurological: as above Psychiatric: anxiety Endocrine: No palpitations, fatigue, diaphoresis, mood swings, change in appetite, change in weight, increased thirst Hematologic/Lymphatic:  No anemia, purpura, petechiae. Allergic/Immunologic: no itchy/runny eyes, nasal congestion, recent allergic reactions, rashes  PHYSICAL EXAM: Filed Vitals:   09/30/14 0954  BP: 102/78  Pulse: 88  Resp: 20   General: No acute distress Head:  Normocephalic/atraumatic Eyes:  fundi unremarkable, without vessel changes, exudates, hemorrhages or papilledema. Neck: supple, no paraspinal tenderness, full range of motion Back: No paraspinal tenderness Heart: regular rate and rhythm Lungs: Clear to auscultation bilaterally. Vascular: No carotid bruits. Neurological Exam: Mental status: alert and oriented to person, place, and time, recent and remote memory intact, fund of knowledge intact, attention and concentration intact, speech fluent and not dysarthric, language intact. Cranial nerves: CN I: not tested CN II: pupils equal, round and reactive to light, visual fields intact, fundi unremarkable, without vessel changes, exudates, hemorrhages or papilledema. CN III, IV, VI:  full range of motion, no nystagmus, no ptosis CN V: facial sensation intact CN VII: upper and lower face symmetric CN VIII: hearing intact CN IX, X: gag intact, uvula midline CN XI: sternocleidomastoid and trapezius muscles intact CN XII: tongue midline Bulk & Tone: normal, no fasciculations. Motor:  5/5 throughout.  Very fine tremor in both hands which resolve when  distracted. Sensation:  Temperature and vibration intact. Deep Tendon Reflexes:  2+ throughout, toes downgoing Finger to nose testing:  No dysmetria Heel to shin:  On dysmetria Gait:  Normal station and stride.  Able to turn and walk in tandem. Romberg negative.  IMPRESSION: Chronic migraine without aura, intractable Blurred vision Tremor, likely related to anxiety.  Tremor resolves when distracted. Anxiety Tobacco use  PLAN: 1.  Restart amitriptyline 25mg  at bedtime nightly for 1 week, then increase to 50mg  at bedtime.  Call in 4 weeks with update and we can adjust dose further if needed. 2.  Sumatriptan 100mg  for abortive therapy 3.  Since this is a new constant headache for several months, as well as experiencing blurred vision, will get MRI of brain with and without contrast. 4.  Smoking cessation 5.  Follow up in 3 months 6.  She is having an eye exam on Thursday.  Asked that she have findings/note sent to us.  Thank you for allowing me to take part in the care of this patient.  Shon MilletAdam Madolyn Ackroyd, DO  CC:  Eustaquio BoydenJavier Gutierrez, MD

## 2014-10-02 ENCOUNTER — Telehealth: Payer: Self-pay | Admitting: *Deleted

## 2014-10-02 NOTE — Telephone Encounter (Signed)
Per Dondra SpryGail at [facility] no pre certification is required for this test with this health plan

## 2014-10-03 ENCOUNTER — Telehealth: Payer: Self-pay | Admitting: Neurology

## 2014-10-03 ENCOUNTER — Telehealth: Payer: Self-pay | Admitting: Family Medicine

## 2014-10-03 ENCOUNTER — Ambulatory Visit
Admission: RE | Admit: 2014-10-03 | Discharge: 2014-10-03 | Disposition: A | Discharge status: Home / Self Care | Payer: 59 | Source: Ambulatory Visit | Attending: Neurology | Admitting: Neurology

## 2014-10-03 DIAGNOSIS — G43719 Chronic migraine without aura, intractable, without status migrainosus: Secondary | ICD-10-CM

## 2014-10-03 DIAGNOSIS — R251 Tremor, unspecified: Secondary | ICD-10-CM

## 2014-10-03 DIAGNOSIS — F419 Anxiety disorder, unspecified: Secondary | ICD-10-CM

## 2014-10-03 MED ORDER — GADOBENATE DIMEGLUMINE 529 MG/ML IV SOLN: 10.0000 mL | Freq: Once | INTRAVENOUS | Status: AC | PRN

## 2014-10-03 NOTE — Telephone Encounter (Signed)
Patient was notified of results.  

## 2014-10-03 NOTE — Telephone Encounter (Signed)
-----   Message from Drema DallasAdam R Jaffe, DO sent at 10/03/2014  3:38 PM EDT ----- Please let patient  Know that the MRI of the brain looks unremarkable.  There are two very tiny spots on the brain which look benign and can be seen in people with migraine.

## 2014-10-03 NOTE — Telephone Encounter (Signed)
I did notify her of her MRI results. She wanted to make you aware of the outcome of her eye doctor appt.

## 2014-10-03 NOTE — Telephone Encounter (Signed)
Please review

## 2014-10-03 NOTE — Telephone Encounter (Signed)
Shannon Mcdowell ClientHannah called and wanted to know if her MRI results were ready and also to let Dr Everlena CooperJaffe know that she went for her eye doctors appointment and she does not need glasses, please [phone]/Dawn

## 2014-10-09 ENCOUNTER — Ambulatory Visit: Payer: 59 | Admitting: Neurology

## 2014-12-05 ENCOUNTER — Ambulatory Visit: Payer: 59 | Admitting: Neurology

## 2014-12-25 ENCOUNTER — Encounter: Payer: Self-pay | Admitting: Internal Medicine

## 2014-12-25 ENCOUNTER — Other Ambulatory Visit: Payer: Self-pay | Admitting: Internal Medicine

## 2014-12-25 ENCOUNTER — Ambulatory Visit (INDEPENDENT_AMBULATORY_CARE_PROVIDER_SITE_OTHER): Payer: 59 | Admitting: Internal Medicine

## 2014-12-25 VITALS — BP 106/70 | HR 83 | Temp 98.3°F | Wt 108.0 lb

## 2014-12-25 DIAGNOSIS — B373 Candidiasis of vulva and vagina: Secondary | ICD-10-CM | POA: Diagnosis not present

## 2014-12-25 DIAGNOSIS — B379 Candidiasis, unspecified: Secondary | ICD-10-CM

## 2014-12-25 DIAGNOSIS — Z1159 Encounter for screening for other viral diseases: Secondary | ICD-10-CM

## 2014-12-25 DIAGNOSIS — N390 Urinary tract infection, site not specified: Secondary | ICD-10-CM

## 2014-12-25 DIAGNOSIS — T3695XA Adverse effect of unspecified systemic antibiotic, initial encounter: Secondary | ICD-10-CM

## 2014-12-25 LAB — POCT URINALYSIS DIPSTICK
Bilirubin, UA: NEGATIVE
Glucose, UA: NEGATIVE
KETONES UA: NEGATIVE
NITRITE UA: NEGATIVE
SPEC GRAV UA: 1.02
Urobilinogen, UA: NEGATIVE
pH, UA: 7

## 2014-12-25 MED ORDER — FLUCONAZOLE 150 MG PO TABS: 150.0000 mg | ORAL_TABLET | Freq: Once | ORAL | Status: DC

## 2014-12-25 MED ORDER — CIPROFLOXACIN HCL 500 MG PO TABS: 500.0000 mg | ORAL_TABLET | Freq: Two times a day (BID) | ORAL | Status: DC

## 2014-12-25 NOTE — Progress Notes (Signed)
Pre visit review using our clinic review tool, if applicable. No additional management support is needed unless otherwise documented below in the visit note. 

## 2014-12-25 NOTE — Progress Notes (Signed)
Subjective:    Patient ID: Shannon Mcdowell, female    DOB: 1995-10-24, 19 y.o.   MRN: [id number]  HPI  Pt presents to the clinic today with c/o abdominal pain or pressure. This started 2 days ago. She describes the pain as crampy. She has had some increased urinary frequency and urgency but denies dysuria. Her bowels are moving normal. She has had some nausea but denies vomiting or blood in her stool. She denies fever, chills or body aches. She has not tried anything OTC. Her LMP was 11/17/14, her periods are irregular. She denies possibility of pregnancy, she reports that she is sexually active with women only.  Of note, she wants to be screened for Hep C. Her last sexual contact had Hep C. She has never used IV drugs.  Review of Systems      Past Medical History  Diagnosis Date  . Depression with anxiety   . Generalized headaches     occasional  . GERD (gastroesophageal reflux disease)   . Hx of migraines   . History of chicken pox     No current outpatient prescriptions on file.   No current facility-administered medications for this visit.    No Known Allergies  Family History  Problem Relation Age of Onset  . Cancer Paternal Grandfather     leukemia  . Cancer Paternal Grandmother     breast& lung  . Hypertension Father   . CAD Other     mat great grandmother  . Diabetes Maternal Grandfather   . Stroke Neg Hx   . Mental illness Maternal Grandfather     suicide (shot himself)  . Anxiety disorder Brother     Social History   Social History  . Marital Status: Single    Spouse Name: N/A  . Number of Children: N/A  . Years of Education: N/A   Occupational History  . Not on file.   Social History Main Topics  . Smoking status: Former Games developer  . Smokeless tobacco: Never Used  . Alcohol Use: 0.0 oz/week    0 Standard drinks or equivalent per week     Comment: social   . Drug Use: Yes     Comment: h/o MJ  . Sexual Activity:    Partners: Female   Other  Topics Concern  . Not on file   Social History Narrative   Caffeine: lots of mountain dew/pepsi   Lives with father alternating with mother.     Older brother.   Occupation: Airline pilot   Activity: no regular exercise   Diet: good water, fruits/vegetables daily     Constitutional: Denies fever, malaise, fatigue, headache or abrupt weight changes.  Respiratory: Denies difficulty breathing, shortness of breath, cough or sputum production.   Cardiovascular: Denies chest pain, chest tightness, palpitations or swelling in the hands or feet.  Gastrointestinal: Pt reports abdominal pain. Denies bloating, constipation, diarrhea or blood in the stool.  GU: Pt reports urgency, and frequency. Denies pain with urination, burning sensation, blood in urine, odor or discharge.  No other specific complaints in a complete review of systems (except as listed in HPI above).  Objective:   Physical Exam   BP 106/70 mmHg  Pulse 83  Temp(Src) 98.3 F (36.8 C) (Oral)  Wt 108 lb (48.988 kg)  SpO2 98%  LMP 11/17/2014 Wt Readings from Last 3 Encounters:  12/25/14 108 lb (48.988 kg)  10/03/14 114 lb (51.71 kg) (22 %*, Z = -0.78)  09/30/14 114 lb 6.4  oz (51.891 kg) (23 %*, Z = -0.75)   * Growth percentiles are based on CDC 2-20 Years data.    General: Appears her stated age, well developed, well nourished in NAD. Skin: Warm, dry and intact. No rashes, lesions or ulcerations noted. Cardiovascular: Normal rate and rhythm. S1,S2 noted.  No murmur, rubs or gallops noted.  Pulmonary/Chest: Normal effort and positive vesicular breath sounds. No respiratory distress. No wheezes, rales or ronchi noted.  Abdomen: Soft and tender to palpation over the bladder. Normal bowel sounds, no bruits noted. No distention or masses noted. No CVA tenderness noted.  BMET    Component Value Date/Time   NA 138 07/24/2014 1720   NA 139 09/04/2012 2355   K 4.1 07/24/2014 1720   K 3.8 09/04/2012 2355   CL 104 07/24/2014 1720     CL 105 09/04/2012 2355   CO2 28 07/24/2014 1720   CO2 27* 09/04/2012 2355   GLUCOSE 86 07/24/2014 1720   GLUCOSE 88 09/04/2012 2355   BUN 14 07/24/2014 1720   BUN 9 09/04/2012 2355   CREATININE 0.83 07/24/2014 1720   CREATININE 0.64 09/04/2012 2355   CALCIUM 9.9 07/24/2014 1720   CALCIUM 9.2 09/04/2012 2355        Assessment & Plan:   Urgency, frequency and abdominal pain:  Urinalysis: 3+ leuks, 3+ blood She did not give enough urine to send a urine culture eRx for Cipro 500 mg BID x 5 days eRx for Diflucan for antibiotic induced yeast infection She is not interested in STD testing at this time Drink plenty of fluids  Screening for Hep C:  Hep C antibody with reflex today  RTC as needed or if symptoms persist or worsen

## 2014-12-25 NOTE — Patient Instructions (Signed)

## 2014-12-26 LAB — HEPATITIS C ANTIBODY: HCV Ab: NEGATIVE

## 2015-01-02 ENCOUNTER — Telehealth (INDEPENDENT_AMBULATORY_CARE_PROVIDER_SITE_OTHER): Payer: 59

## 2015-01-02 DIAGNOSIS — R319 Hematuria, unspecified: Secondary | ICD-10-CM

## 2015-01-02 DIAGNOSIS — N390 Urinary tract infection, site not specified: Secondary | ICD-10-CM | POA: Diagnosis not present

## 2015-01-02 NOTE — Telephone Encounter (Signed)
Spoke with patient and she will come in and drop off UA next week with the understanding that if she starts feeling worse over the weekend to go to [facility]. She said she doesn't feel bad, she just knows UTI isn't completely gone.

## 2015-01-02 NOTE — Telephone Encounter (Signed)
Pt was seen by Nicki Reaper NP on 12/25/14; has finished Cipro and diflucan. Pt did not void enough at appt for C/S per office note. Now pt still has mid upper abd pain and urine appears cloudy. Pt is not drinking very much water.  No burning or pain upon urination,no frequency, no back pain or fever. No unusual vaginal discharge and no vaginal itching. Pt request cb. [facility].

## 2015-01-02 NOTE — Telephone Encounter (Signed)
plz come in to drop off urine for repeat urinalysis and if abnormal send for micro/culture.

## 2015-01-08 LAB — POCT URINALYSIS DIPSTICK
Bilirubin, UA: NEGATIVE
Blood, UA: NEGATIVE
Glucose, UA: NEGATIVE
KETONES UA: NEGATIVE
LEUKOCYTES UA: NEGATIVE
NITRITE UA: NEGATIVE
PH UA: 6
Protein, UA: NEGATIVE
Spec Grav, UA: >=1.03
Urobilinogen, UA: 1

## 2015-01-08 NOTE — Telephone Encounter (Signed)
Patient dropped off UA. Results in chart and drawn for culture if desired.

## 2015-01-08 NOTE — Addendum Note (Signed)
Addended by: Josph Macho A on: 01/08/2015 07:26 PM   Modules accepted: Orders

## 2015-01-22 ENCOUNTER — Ambulatory Visit: Payer: Managed Care, Other (non HMO) | Admitting: Neurology

## 2015-02-16 ENCOUNTER — Telehealth: Payer: Self-pay | Admitting: Family Medicine

## 2015-02-16 NOTE — Telephone Encounter (Signed)
Patient Name: Shannon Mcdowell DOB: 02/05/1996 Initial Comment caller states she fell and hit her head night before last, she is having nausea, neck and head pain with back soreness Nurse Assessment Nurse: Charna Elizabeth, RN, Lynden Ang Date/Time (Eastern Time): 02/16/2015 11:36:18 AM Confirm and document reason for call. If symptomatic, describe symptoms. ---Caller states that she hit her head two nights ago when she fell while playing basketball on a hardwood floor. She continues to have head, neck and back pain. No cuts to the skin. Alert and responsive. Has the patient traveled out of the country within the last 30 days? ---No Does the patient require triage? ---Yes Related visit to physician within the last 2 weeks? ---Yes Does the PT have any chronic conditions? (i.e. diabetes, asthma, etc.) ---Yes List chronic conditions. ---Anxiety Did the patient indicate they were pregnant? ---No Guidelines Guideline Title Affirmed Question Affirmed Notes Head Injury [1] ACUTE NEURO SYMPTOM AND [2] now fine (DEFINITION: difficult to awaken OR confused thinking and talking OR slurred speech OR weakness of arms OR unsteady walking) Neck Injury [1] Dangerous mechanism of injury (e.g., MVA, contact sports, diving, fall on trampoline, fall > 10 feet or 3 meters) AND [2] neck pain or stiffness began > 1 hour after injury Final Disposition User Go to ED Now Charna Elizabeth, RN, [employer] Referrals [facility] - ED Disagree/Comply: Comply

## 2015-02-16 NOTE — Telephone Encounter (Signed)
plz call tomorrow for an update. 

## 2015-02-17 NOTE — Telephone Encounter (Signed)
Spoke with patient and she said she is feeling better. No more nausea, neck or back pain. She wasn't actually playing basketball. She was trying to sit on a "high" chair and stood on a basketball as a stepstool and it rolled out from underneath of her causing her to fall.

## 2015-02-19 ENCOUNTER — Ambulatory Visit (INDEPENDENT_AMBULATORY_CARE_PROVIDER_SITE_OTHER): Payer: 59 | Admitting: Family Medicine

## 2015-02-19 ENCOUNTER — Encounter: Payer: Self-pay | Admitting: Family Medicine

## 2015-02-19 VITALS — BP 98/92 | HR 88 | Temp 98.0°F | Wt 108.8 lb

## 2015-02-19 DIAGNOSIS — N898 Other specified noninflammatory disorders of vagina: Secondary | ICD-10-CM

## 2015-02-19 DIAGNOSIS — F4323 Adjustment disorder with mixed anxiety and depressed mood: Secondary | ICD-10-CM

## 2015-02-19 DIAGNOSIS — G43019 Migraine without aura, intractable, without status migrainosus: Secondary | ICD-10-CM

## 2015-02-19 MED ORDER — HYDROXYZINE HCL 10 MG PO TABS: 5.0000 mg | ORAL_TABLET | Freq: Two times a day (BID) | ORAL | Status: DC | PRN

## 2015-02-19 NOTE — Assessment & Plan Note (Addendum)
Wet prep sent today. Await prep prior to starting treatment.  H/o trich x2, BV x1, yeast x1 over last year. Discussed starting yogurt or probiotic daily.

## 2015-02-19 NOTE — Progress Notes (Signed)
Pre visit review using our clinic review tool, if applicable. No additional management support is needed unless otherwise documented below in the visit note. 

## 2015-02-19 NOTE — Assessment & Plan Note (Signed)
Off ppx med and pt denies significant migraine concerns. Discussed MRI results. Will update [place] if sxs recur.

## 2015-02-19 NOTE — Assessment & Plan Note (Signed)
Will trial hydroxyzine 5-10mg  prn anxiety. Discussed sedation precautions. Prior on klonopin, celexa.

## 2015-02-19 NOTE — Patient Instructions (Addendum)
Start yogurt or probiotic daily.  Try hydroxyzine 5-10mg  as needed for anxiety (Sent to pharmacy) We have sent wet prep and will call you with results. MRI brain printed out for you - spots on brain are looking ok.

## 2015-02-19 NOTE — Progress Notes (Signed)
   BP 98/92 mmHg  Pulse 88  Temp(Src) 98 F (36.7 C) (Oral)  Wt 108 lb 12 oz (49.329 kg)  LMP 02/04/2015   CC: vag discharge   Subjective:    Patient ID: Shannon Mcdowell, female    DOB: 1996-04-04, 19 y.o.   MRN: [id number]  HPI: Shannon Mcdowell is a 19 y.o. female presenting on 02/19/2015 for Vaginal Discharge   New vaginal discharge over last 1-[redacted] wks along with lower abd pain - describes thick clumpy discharge.  Started using disposable wipes. No douching, no bubble baths, no hot tubs.  Not currently sexually active. No dysuria, urgency, frequency, flank pain, fevers/ nausea.  09/2014 - candidal vaginitis 07/2014 - BV, trichomonas 05/2013 - trichomonas Last STD screen 07/2104 WNL.  Fall on hardwood floor - hit right posterior head. Initially some R neck pain but that has resolved.  Migraines - prior on amitriptyline. Saw neurologist with normal MRI. Stopped amitriptyline 2/2 nausea. States not too bothersome even off ppx med.   Mood - no recent SI, cutting. Does notice increased anxiety recently, asks about OTC remedy for this vs prn med.  Relevant past medical, surgical, family and social history reviewed and updated as indicated. Interim medical history since our last visit reviewed. Allergies and medications reviewed and updated. No current outpatient prescriptions on file prior to visit.   No current facility-administered medications on file prior to visit.    Review of Systems Per HPI unless specifically indicated above     Objective:    BP 98/92 mmHg  Pulse 88  Temp(Src) 98 F (36.7 C) (Oral)  Wt 108 lb 12 oz (49.329 kg)  LMP 02/04/2015  Wt Readings from Last 3 Encounters:  02/19/15 108 lb 12 oz (49.329 kg)  12/25/14 108 lb (48.988 kg)  10/03/14 114 lb (51.71 kg) (22 %*, Z = -0.78)   * Growth percentiles are based on CDC 2-20 Years data.    Physical Exam  Constitutional: She appears well-developed and well-nourished. No distress.  Genitourinary: Uterus  normal. Pelvic exam was performed with patient supine. There is no rash, tenderness or lesion on the right labia. There is no rash, tenderness or lesion on the left labia. Cervix exhibits no motion tenderness and no friability. Right adnexum displays no mass, no tenderness and no fullness. Left adnexum displays no mass, no tenderness and no fullness. No tenderness in the vagina. No signs of injury around the vagina. Vaginal discharge found.  Thin white discharge present  Psychiatric: She has a normal mood and affect.  Nursing note and vitals reviewed.      Assessment & Plan:   Problem List Items Addressed This Visit    Vaginal discharge - Primary    Wet prep sent today. Await prep prior to starting treatment.  H/o trich x2, BV x1, yeast x1 over last year. Discussed starting yogurt or probiotic daily.      Migraine headache    Off ppx med and pt denies significant migraine concerns. Discussed MRI results. Will update [place] if sxs recur.       Adjustment disorder with mixed anxiety and depressed mood    Will trial hydroxyzine 5-10mg  prn anxiety. Discussed sedation precautions. Prior on klonopin, celexa.           Follow up plan: Return if symptoms worsen or fail to improve.

## 2015-02-19 NOTE — Addendum Note (Signed)
Addended by: Alvina Chou on: 02/19/2015 03:56 PM   Modules accepted: Orders

## 2015-02-20 ENCOUNTER — Other Ambulatory Visit: Payer: Self-pay | Admitting: Family Medicine

## 2015-02-20 LAB — WET PREP BY MOLECULAR PROBE
Candida species: NEGATIVE
Gardnerella vaginalis: POSITIVE — AB
Trichomonas vaginosis: NEGATIVE

## 2015-02-20 MED ORDER — METRONIDAZOLE 500 MG PO TABS: 500.0000 mg | ORAL_TABLET | Freq: Two times a day (BID) | ORAL | Status: DC

## 2015-03-05 NOTE — Telephone Encounter (Signed)
Pt called and would like a return call from CMA Kim D. Re: her last visit.  She would not any other specifics.  Best number to call [phone]

## 2015-03-06 NOTE — Telephone Encounter (Signed)
Yes start daily probiotic. If this doesn't help, return for recheck.

## 2015-03-06 NOTE — Telephone Encounter (Signed)
Spoke with patient. She finished flagyl, but said she still feels like she isn't completely better. She said she still has some white discharge and wonders what to do next. She said she absolutely cannot take the flagyl anymore because it tastes horrible. She was asking if she should come in for recheck to see if it was cleared. She also asks if she should start taking a probiotic daily to see if that may help. It seems to happen around the time of her period.

## 2015-03-06 NOTE — Telephone Encounter (Signed)
Message left for patient to return my call.  

## 2015-03-09 NOTE — Telephone Encounter (Signed)
Patient notified

## 2015-04-30 ENCOUNTER — Ambulatory Visit (INDEPENDENT_AMBULATORY_CARE_PROVIDER_SITE_OTHER): Payer: 59 | Admitting: Family Medicine

## 2015-04-30 ENCOUNTER — Encounter: Payer: Self-pay | Admitting: Family Medicine

## 2015-04-30 VITALS — BP 104/60 | HR 82 | Temp 98.1°F | Wt 110.2 lb

## 2015-04-30 DIAGNOSIS — R300 Dysuria: Secondary | ICD-10-CM | POA: Diagnosis not present

## 2015-04-30 DIAGNOSIS — N898 Other specified noninflammatory disorders of vagina: Secondary | ICD-10-CM

## 2015-04-30 LAB — POCT URINALYSIS DIPSTICK
Bilirubin, UA: NEGATIVE
Blood, UA: NEGATIVE
Glucose, UA: NEGATIVE
Ketones, UA: NEGATIVE
Nitrite, UA: NEGATIVE
PROTEIN UA: NEGATIVE
Spec Grav, UA: >=1.03
Urobilinogen, UA: 0.2
pH, UA: 6

## 2015-04-30 MED ORDER — METRONIDAZOLE 0.75 % VA GEL: 1.0000 | Freq: Two times a day (BID) | VAGINAL | Status: DC

## 2015-04-30 NOTE — Addendum Note (Signed)
Addended by: Josph MachoANCE, KIMBERLY A on: 04/30/2015 06:26 PM   Modules accepted: Orders

## 2015-04-30 NOTE — Assessment & Plan Note (Signed)
Anticipate sxs due to recurrent BV (possible clue cells on urine microscopy). Treat with topical metrogel, send off wet prep and UCx. Update if any change in treatment needed. rec restart probiotic. Pt agrees with plan.

## 2015-04-30 NOTE — Addendum Note (Signed)
Addended by: Josph MachoANCE, KIMBERLY A on: 04/30/2015 06:27 PM   Modules accepted: Orders

## 2015-04-30 NOTE — Patient Instructions (Addendum)
Urine culture and wet prep sent off.  We will call you with results.  In the meantime take metrogel for likely BV.  Restart probiotic.

## 2015-04-30 NOTE — Addendum Note (Signed)
Addended by: Eustaquio BoydenGUTIERREZ, Isla Sabree on: 04/30/2015 07:43 PM   Modules accepted: Orders, SmartSet

## 2015-04-30 NOTE — Progress Notes (Signed)
Pre visit review using our clinic review tool, if applicable. No additional management support is needed unless otherwise documented below in the visit note. 

## 2015-04-30 NOTE — Progress Notes (Signed)
BP 104/60 mmHg  Pulse 82  Temp(Src) 98.1 F (36.7 C) (Oral)  Wt 110 lb 4 oz (50.009 kg)  SpO2 98%   CC: dysuria  Subjective:    Patient ID: Shannon Mcdowell, female    DOB: 05/01/1996, 19 y.o.   MRN: [id number]  HPI: Shannon Mcdowell is a 19 y.o. female presenting on 04/30/2015 for Urinary Tract Infection   Gen abd discomfort associated with lower back discomfort over last 2 days. Some odor that started 4 d ago. Some vaginal discharge. Mild dysuria but on outside.   No recent sexual activity.  She started taking probiotic but stopped 1 wk ago.  No fevers/chills, nausea, hematuria.  Uses nonscented tampons  LMP 11/20//2016  H/o trich x2, BV x2, yeast x1 over last year.  Relevant past medical, surgical, family and social history reviewed and updated as indicated. Interim medical history since our last visit reviewed. Allergies and medications reviewed and updated. Current Outpatient Prescriptions on File Prior to Visit  Medication Sig  . hydrOXYzine (ATARAX/VISTARIL) 10 MG tablet Take 0.5-1 tablets (5-10 mg total) by mouth 2 (two) times daily as needed for anxiety.   No current facility-administered medications on file prior to visit.    Review of Systems Per HPI unless specifically indicated in ROS section     Objective:    BP 104/60 mmHg  Pulse 82  Temp(Src) 98.1 F (36.7 C) (Oral)  Wt 110 lb 4 oz (50.009 kg)  SpO2 98%  Wt Readings from Last 3 Encounters:  04/30/15 110 lb 4 oz (50.009 kg)  02/19/15 108 lb 12 oz (49.329 kg)  12/25/14 108 lb (48.988 kg)    Physical Exam  Constitutional: She appears well-developed and well-nourished. No distress.  Abdominal: Soft. Normal appearance and bowel sounds are normal. She exhibits no distension and no mass. There is no hepatosplenomegaly. There is generalized tenderness (mild). There is no rigidity, no rebound, no guarding, no CVA tenderness and negative Murphy's sign.  Genitourinary: There is no rash, tenderness or  lesion on the right labia. There is no rash, tenderness or lesion on the left labia. No erythema or tenderness in the vagina. Vaginal discharge (mild) found.  Nursing note and vitals reviewed.  Results for orders placed or performed in visit on 04/30/15  POCT Urinalysis Dipstick  Result Value Ref Range   Color, UA Yellow    Clarity, UA Clear    Glucose, UA Neg    Bilirubin, UA Neg    Ketones, UA Neg    Spec Grav, UA >=1.030    Blood, UA Neg    pH, UA 6.0    Protein, UA Neg    Urobilinogen, UA 0.2    Nitrite, UA Neg    Leukocytes, UA moderate (2+) (A) Negative      Assessment & Plan:   Problem List Items Addressed This Visit    Vaginal discharge - Primary    Anticipate sxs due to recurrent BV (possible clue cells on urine microscopy). Treat with topical metrogel, send off wet prep and UCx. Update if any change in treatment needed. rec restart probiotic. Pt agrees with plan.      Relevant Orders   Wet prep, genital    Other Visit Diagnoses    Dysuria        Relevant Orders    POCT Urinalysis Dipstick (Completed)    Urine culture        Follow up plan: Return if symptoms worsen or fail to improve.

## 2015-05-01 ENCOUNTER — Telehealth: Payer: Self-pay | Admitting: Family Medicine

## 2015-05-01 NOTE — Telephone Encounter (Signed)
Patient called to get the results of her lab work. °

## 2015-05-02 LAB — URINE CULTURE
COLONY COUNT: NO GROWTH
Organism ID, Bacteria: NO GROWTH

## 2015-05-02 LAB — WET PREP, GENITAL

## 2015-05-04 LAB — WET PREP BY MOLECULAR PROBE
CANDIDA SPECIES: NEGATIVE
Gardnerella vaginalis: POSITIVE — AB
TRICHOMONAS VAG: NEGATIVE

## 2015-05-04 NOTE — Telephone Encounter (Signed)
Pt called checking on lab results Best number to call [phone]

## 2015-05-04 NOTE — Telephone Encounter (Signed)
Spoke with patient and notified of lab results.

## 2015-08-11 ENCOUNTER — Other Ambulatory Visit: Payer: Self-pay | Admitting: Family Medicine

## 2015-08-11 DIAGNOSIS — Z131 Encounter for screening for diabetes mellitus: Secondary | ICD-10-CM

## 2015-08-11 DIAGNOSIS — Z309 Encounter for contraceptive management, unspecified: Secondary | ICD-10-CM

## 2015-08-12 ENCOUNTER — Other Ambulatory Visit (INDEPENDENT_AMBULATORY_CARE_PROVIDER_SITE_OTHER): Payer: 59

## 2015-08-12 DIAGNOSIS — Z131 Encounter for screening for diabetes mellitus: Secondary | ICD-10-CM | POA: Diagnosis not present

## 2015-08-12 DIAGNOSIS — Z309 Encounter for contraceptive management, unspecified: Secondary | ICD-10-CM

## 2015-08-12 LAB — CBC WITH DIFFERENTIAL/PLATELET
BASOS ABS: 0 10*3/uL (ref 0.0–0.1)
BASOS PCT: 0 % (ref 0–1)
Eosinophils Absolute: 0.2 10*3/uL (ref 0.0–0.7)
Eosinophils Relative: 3 % (ref 0–5)
HCT: 40.5 % (ref 36.0–46.0)
HEMOGLOBIN: 13.7 g/dL (ref 12.0–15.0)
Lymphocytes Relative: 35 % (ref 12–46)
Lymphs Abs: 2.4 10*3/uL (ref 0.7–4.0)
MCH: 32.5 pg (ref 26.0–34.0)
MCHC: 33.8 g/dL (ref 30.0–36.0)
MCV: 96 fL (ref 78.0–100.0)
MONO ABS: 0.7 10*3/uL (ref 0.1–1.0)
MPV: 9.5 fL (ref 8.6–12.4)
Monocytes Relative: 10 % (ref 3–12)
NEUTROS ABS: 3.5 10*3/uL (ref 1.7–7.7)
NEUTROS PCT: 52 % (ref 43–77)
PLATELETS: 265 10*3/uL (ref 150–400)
RBC: 4.22 MIL/uL (ref 3.87–5.11)
RDW: 12.5 % (ref 11.5–15.5)
WBC: 6.8 10*3/uL (ref 4.0–10.5)

## 2015-08-13 LAB — BASIC METABOLIC PANEL
BUN: 10 mg/dL (ref 7–25)
CALCIUM: 9.3 mg/dL (ref 8.6–10.2)
CO2: 26 mmol/L (ref 20–31)
Chloride: 103 mmol/L (ref 98–110)
Creat: 0.7 mg/dL (ref 0.50–1.10)
Glucose, Bld: 80 mg/dL (ref 65–99)
POTASSIUM: 4 mmol/L (ref 3.5–5.3)
SODIUM: 139 mmol/L (ref 135–146)

## 2015-08-19 ENCOUNTER — Encounter: Payer: Self-pay | Admitting: Family Medicine

## 2015-08-19 ENCOUNTER — Ambulatory Visit (INDEPENDENT_AMBULATORY_CARE_PROVIDER_SITE_OTHER): Payer: 59 | Admitting: Family Medicine

## 2015-08-19 VITALS — BP 110/70 | HR 80 | Temp 98.2°F | Ht 63.5 in | Wt 108.8 lb

## 2015-08-19 DIAGNOSIS — N898 Other specified noninflammatory disorders of vagina: Secondary | ICD-10-CM | POA: Diagnosis not present

## 2015-08-19 DIAGNOSIS — Z0000 Encounter for general adult medical examination without abnormal findings: Secondary | ICD-10-CM

## 2015-08-19 DIAGNOSIS — N926 Irregular menstruation, unspecified: Secondary | ICD-10-CM | POA: Diagnosis not present

## 2015-08-19 DIAGNOSIS — Z113 Encounter for screening for infections with a predominantly sexual mode of transmission: Secondary | ICD-10-CM | POA: Diagnosis not present

## 2015-08-19 LAB — POC URINALSYSI DIPSTICK (AUTOMATED)
BILIRUBIN UA: NEGATIVE
Glucose, UA: NEGATIVE
KETONES UA: NEGATIVE
Nitrite, UA: NEGATIVE
PH UA: 5.5
Protein, UA: NEGATIVE
RBC UA: NEGATIVE
Urobilinogen, UA: NEGATIVE

## 2015-08-19 LAB — POCT URINE PREGNANCY: Preg Test, Ur: NEGATIVE

## 2015-08-19 NOTE — Progress Notes (Signed)
BP 110/70 mmHg  Pulse 80  Temp(Src) 98.2 F (36.8 C) (Oral)  Ht 5' 3.5" (1.613 m)  Wt 108 lb 12 oz (49.329 kg)  BMI 18.96 kg/m2  SpO2 99%  LMP 06/16/2015   CC: CPE  Subjective:    Patient ID: Shannon Mcdowell, female    DOB: [birth year], 20 y.o.   MRN: [id number]  HPI: Shannon Mcdowell is a 20 y.o. female presenting on 08/19/2015 for Annual Exam; STD Check; and Vaginal Discharge   Vaginal discharge - started 1 wk ago. Sexually active with females only. Mild dysuria. Mild odor. Mild lower abd discomfort.   Last BV 04/2015 treated with metrogel with resolution of sxs. She did well with this. She has trouble tolerating metrogel.   H/o BV x3, trich x2, yeast x1 over last 2 yrs.   Preventative: Completed HPV series.  Thinks Tdap around 2008. Well woman - saw [facility] 2013.Was on birth control, then stopped.  Sexually active with women. 4 partners in last 12 months.  LMP 06/16/2015. Prior to January she had regular periods. Denies chances of infection. When on birth control had trouble   Caffeine: lots of mountain dew/pepsi  Lives with father alternating with mother. They are separated. Dogs at home.  Has an older brother. Edu: [employer] studying criminal justice Occupation: Press photographer, Surveyor, mining Activity: no regular exercise  Diet: good water, fruits/vegetables daily, significant Dr Malachi Bonds and sweet tea.  Relevant past medical, surgical, family and social history reviewed and updated as indicated. Interim medical history since our last visit reviewed. Allergies and medications reviewed and updated. Current Outpatient Prescriptions on File Prior to Visit  Medication Sig  . hydrOXYzine (ATARAX/VISTARIL) 10 MG tablet Take 0.5-1 tablets (5-10 mg total) by mouth 2 (two) times daily as needed for anxiety.   No current facility-administered medications on file prior to visit.    Review of Systems  Constitutional: Negative for fever, chills, activity change, appetite change, fatigue  and unexpected weight change.  HENT: Negative for hearing loss.   Eyes: Negative for visual disturbance.  Respiratory: Negative for cough, chest tightness, shortness of breath and wheezing.   Cardiovascular: Negative for chest pain, palpitations and leg swelling.  Gastrointestinal: Positive for abdominal pain (lower abd). Negative for nausea, vomiting, diarrhea, constipation, blood in stool and abdominal distention.  Genitourinary: Positive for vaginal discharge and menstrual problem. Negative for frequency, hematuria, flank pain and difficulty urinating.  Musculoskeletal: Negative for myalgias, arthralgias and neck pain.  Skin: Negative for rash.  Neurological: Positive for headaches (h/o migraines). Negative for dizziness, seizures and syncope.  Hematological: Negative for adenopathy. Does not bruise/bleed easily.  Psychiatric/Behavioral: Negative for dysphoric mood. The patient is not nervous/anxious.    Per HPI unless specifically indicated in ROS section     Objective:    BP 110/70 mmHg  Pulse 80  Temp(Src) 98.2 F (36.8 C) (Oral)  Ht 5' 3.5" (1.613 m)  Wt 108 lb 12 oz (49.329 kg)  BMI 18.96 kg/m2  SpO2 99%  LMP 06/16/2015  Wt Readings from Last 3 Encounters:  08/19/15 108 lb 12 oz (49.329 kg)  04/30/15 110 lb 4 oz (50.009 kg)  02/19/15 108 lb 12 oz (49.329 kg)    Physical Exam  Constitutional: She is oriented to person, place, and time. She appears well-developed and well-nourished. No distress.  HENT:  Head: Normocephalic and atraumatic.  Right Ear: Hearing, tympanic membrane, external ear and ear canal normal.  Left Ear: Hearing, tympanic membrane, external ear and ear  canal normal.  Nose: Nose normal.  Mouth/Throat: Uvula is midline, oropharynx is clear and moist and mucous membranes are normal. No oropharyngeal exudate, posterior oropharyngeal edema or posterior oropharyngeal erythema.  Eyes: Conjunctivae and EOM are normal. Pupils are equal, round, and reactive to  light. No scleral icterus.  Neck: Normal range of motion. Neck supple. No thyromegaly present.  Cardiovascular: Normal rate, regular rhythm, normal heart sounds and intact distal pulses.   No murmur heard. Pulses:      Radial pulses are 2+ on the right side, and 2+ on the left side.  Pulmonary/Chest: Effort normal and breath sounds normal. No respiratory distress. She has no wheezes. She has no rales.  Abdominal: Soft. Normal appearance and bowel sounds are normal. She exhibits no distension and no mass. There is tenderness (mild) in the suprapubic area. There is no rigidity, no rebound, no guarding and negative Murphy's sign.  Genitourinary: Uterus normal. Pelvic exam was performed with patient supine. There is no rash, tenderness or lesion on the right labia. There is no rash, tenderness or lesion on the left labia. Cervix exhibits no motion tenderness, no discharge and no friability. Right adnexum displays no mass, no tenderness and no fullness. Left adnexum displays no mass, no tenderness and no fullness. No erythema, tenderness or bleeding in the vagina. No signs of injury around the vagina. Vaginal discharge found.  Thin discharge present without odor Pap not performed  Wet prep performed  Musculoskeletal: Normal range of motion. She exhibits no edema.  Lymphadenopathy:    She has no cervical adenopathy.  Neurological: She is alert and oriented to person, place, and time.  CN grossly intact, station and gait intact  Skin: Skin is warm and dry. No rash noted.  Psychiatric: She has a normal mood and affect. Her behavior is normal. Judgment and thought content normal.  Nursing note and vitals reviewed.  Results for orders placed or performed in visit on 78/29/56  Basic metabolic panel  Result Value Ref Range   Sodium 139 135 - 146 mmol/L   Potassium 4.0 3.5 - 5.3 mmol/L   Chloride 103 98 - 110 mmol/L   CO2 26 20 - 31 mmol/L   Glucose, Bld 80 65 - 99 mg/dL   BUN 10 7 - 25 mg/dL    Creat 0.70 0.50 - 1.10 mg/dL   Calcium 9.3 8.6 - 10.2 mg/dL  CBC with Differential/Platelet  Result Value Ref Range   WBC 6.8 4.0 - 10.5 K/uL   RBC 4.22 3.87 - 5.11 MIL/uL   Hemoglobin 13.7 12.0 - 15.0 g/dL   HCT 40.5 36.0 - 46.0 %   MCV 96.0 78.0 - 100.0 fL   MCH 32.5 26.0 - 34.0 pg   MCHC 33.8 30.0 - 36.0 g/dL   RDW 12.5 11.5 - 15.5 %   Platelets 265 150 - 400 K/uL   MPV 9.5 8.6 - 12.4 fL   Neutrophils Relative % 52 43 - 77 %   Neutro Abs 3.5 1.7 - 7.7 K/uL   Lymphocytes Relative 35 12 - 46 %   Lymphs Abs 2.4 0.7 - 4.0 K/uL   Monocytes Relative 10 3 - 12 %   Monocytes Absolute 0.7 0.1 - 1.0 K/uL   Eosinophils Relative 3 0 - 5 %   Eosinophils Absolute 0.2 0.0 - 0.7 K/uL   Basophils Relative 0 0 - 1 %   Basophils Absolute 0.0 0.0 - 0.1 K/uL   Smear Review Criteria for review not met  Assessment & Plan:  Micro: WBC 0 RBC 0 Bact tr Casts none Epi few UCx not sent Problem List Items Addressed This Visit    Vaginal discharge    Suspicious for recurrent BV - wet prep sent off. UA/micro reassuring.  Await wet prep. If recurrent BV, 3rd episode in last 6 months. Pt interested in preventative metrogel.  Continue probiotic for ppx.      Relevant Orders   Wet prep, genital   Health maintenance examination - Primary    Preventative protocols reviewed and updated unless pt declined. Discussed healthy diet and lifestyle.       Irregular periods/menstrual cycles    LMP 06/15/2014. Prior to this was regular. Check TSH. Not currently interested in contraceptive. Denies sexual activity with men.  Upreg today.      Relevant Orders   TSH    Other Visit Diagnoses    Screen for STD (sexually transmitted disease)        Relevant Orders    Wet prep, genital    HIV antibody    RPR    GC/Chlamydia Probe Amp        Follow up plan: Return in about 1 year (around 08/18/2016), or as needed, for follow up visit.  Ria Bush, MD

## 2015-08-19 NOTE — Addendum Note (Signed)
Addended by: Eustaquio BoydenGUTIERREZ, Jace Fermin on: 08/19/2015 01:46 PM   Modules accepted: Kipp BroodSmartSet

## 2015-08-19 NOTE — Assessment & Plan Note (Addendum)
LMP 06/15/2014. Prior to this was regular. Check TSH. Not currently interested in contraceptive. Denies sexual activity with men.  Upreg today.

## 2015-08-19 NOTE — Assessment & Plan Note (Addendum)
Suspicious for recurrent BV - wet prep sent off. UA/micro reassuring.  Await wet prep. If recurrent BV, 3rd episode in last 6 months. Pt interested in preventative metrogel.  Continue probiotic for ppx.

## 2015-08-19 NOTE — Progress Notes (Signed)
Pre visit review using our clinic review tool, if applicable. No additional management support is needed unless otherwise documented below in the visit note. 

## 2015-08-19 NOTE — Patient Instructions (Addendum)
Urine looking ok We have sent off wet prep - likely recurrent BV. We will call you with results and plan Return as needed or in 1 year for next physical. Labs today.  Health Maintenance, Female Adopting a healthy lifestyle and getting preventive care can go a long way to promote health and wellness. Talk with your health care provider about what schedule of regular examinations is right for you. This is a good chance for you to check in with your provider about disease prevention and staying healthy. In between checkups, there are plenty of things you can do on your own. Experts have done a lot of research about which lifestyle changes and preventive measures are most likely to keep you healthy. Ask your health care provider for more information. WEIGHT AND DIET  Eat a healthy diet  Be sure to include plenty of vegetables, fruits, low-fat dairy products, and lean protein.  Do not eat a lot of foods high in solid fats, added sugars, or salt.  Get regular exercise. This is one of the most important things you can do for your health.  Most adults should exercise for at least 150 minutes each week. The exercise should increase your heart rate and make you sweat (moderate-intensity exercise).  Most adults should also do strengthening exercises at least twice a week. This is in addition to the moderate-intensity exercise.  Maintain a healthy weight  Body mass index (BMI) is a measurement that can be used to identify possible weight problems. It estimates body fat based on height and weight. Your health care provider can help determine your BMI and help you achieve or maintain a healthy weight.  For females 35 years of age and older:   A BMI below 18.5 is considered underweight.  A BMI of 18.5 to 24.9 is normal.  A BMI of 25 to 29.9 is considered overweight.  A BMI of 30 and above is considered obese.  Watch levels of cholesterol and blood lipids  You should start having your blood  tested for lipids and cholesterol at 21 years of age, then have this test every 5 years.  You may need to have your cholesterol levels checked more often if:  Your lipid or cholesterol levels are high.  You are older than 21 years of age.  You are at high risk for heart disease.  CANCER SCREENING   Lung Cancer  Lung cancer screening is recommended for adults 68-67 years old who are at high risk for lung cancer because of a history of smoking.  A yearly low-dose CT scan of the lungs is recommended for people who:  Currently smoke.  Have quit within the past 15 years.  Have at least a 30-pack-year history of smoking. A pack year is smoking an average of one pack of cigarettes a day for 1 year.  Yearly screening should continue until it has been 15 years since you quit.  Yearly screening should stop if you develop a health problem that would prevent you from having lung cancer treatment.  Breast Cancer  Practice breast self-awareness. This means understanding how your breasts normally appear and feel.  It also means doing regular breast self-exams. Let your health care provider know about any changes, no matter how small.  If you are in your 20s or 30s, you should have a clinical breast exam (CBE) by a health care provider every 1-3 years as part of a regular health exam.  If you are 11 or older, have a  CBE every year. Also consider having a breast X-ray (mammogram) every year.  If you have a family history of breast cancer, talk to your health care provider about genetic screening.  If you are at high risk for breast cancer, talk to your health care provider about having an MRI and a mammogram every year.  Breast cancer gene (BRCA) assessment is recommended for women who have family members with BRCA-related cancers. BRCA-related cancers include:  Breast.  Ovarian.  Tubal.  Peritoneal cancers.  Results of the assessment will determine the need for genetic counseling  and BRCA1 and BRCA2 testing. Cervical Cancer Your health care provider may recommend that you be screened regularly for cancer of the pelvic organs (ovaries, uterus, and vagina). This screening involves a pelvic examination, including checking for microscopic changes to the surface of your cervix (Pap test). You may be encouraged to have this screening done every 3 years, beginning at age 41.  For women ages 36-65, health care providers may recommend pelvic exams and Pap testing every 3 years, or they may recommend the Pap and pelvic exam, combined with testing for human papilloma virus (HPV), every 5 years. Some types of HPV increase your risk of cervical cancer. Testing for HPV may also be done on women of any age with unclear Pap test results.  Other health care providers may not recommend any screening for nonpregnant women who are considered low risk for pelvic cancer and who do not have symptoms. Ask your health care provider if a screening pelvic exam is right for you.  If you have had past treatment for cervical cancer or a condition that could lead to cancer, you need Pap tests and screening for cancer for at least 20 years after your treatment. If Pap tests have been discontinued, your risk factors (such as having a new sexual partner) need to be reassessed to determine if screening should resume. Some women have medical problems that increase the chance of getting cervical cancer. In these cases, your health care provider may recommend more frequent screening and Pap tests. Colorectal Cancer  This type of cancer can be detected and often prevented.  Routine colorectal cancer screening usually begins at 21 years of age and continues through 21 years of age.  Your health care provider may recommend screening at an earlier age if you have risk factors for colon cancer.  Your health care provider may also recommend using home test kits to check for hidden blood in the stool.  A small camera  at the end of a tube can be used to examine your colon directly (sigmoidoscopy or colonoscopy). This is done to check for the earliest forms of colorectal cancer.  Routine screening usually begins at age 52.  Direct examination of the colon should be repeated every 5-10 years through 21 years of age. However, you may need to be screened more often if early forms of precancerous polyps or small growths are found. Skin Cancer  Check your skin from head to toe regularly.  Tell your health care provider about any new moles or changes in moles, especially if there is a change in a mole's shape or color.  Also tell your health care provider if you have a mole that is larger than the size of a pencil eraser.  Always use sunscreen. Apply sunscreen liberally and repeatedly throughout the day.  Protect yourself by wearing long sleeves, pants, a wide-brimmed hat, and sunglasses whenever you are outside. HEART DISEASE, DIABETES, AND HIGH BLOOD  PRESSURE   High blood pressure causes heart disease and increases the risk of stroke. High blood pressure is more likely to develop in:  People who have blood pressure in the high end of the normal range (130-139/85-89 mm Hg).  People who are overweight or obese.  People who are African American.  If you are 18-39 years of age, have your blood pressure checked every 3-5 years. If you are 40 years of age or older, have your blood pressure checked every year. You should have your blood pressure measured twice--once when you are at a hospital or clinic, and once when you are not at a hospital or clinic. Record the average of the two measurements. To check your blood pressure when you are not at a hospital or clinic, you can use:  An automated blood pressure machine at a pharmacy.  A home blood pressure monitor.  If you are between 55 years and 79 years old, ask your health care provider if you should take aspirin to prevent strokes.  Have regular diabetes  screenings. This involves taking a blood sample to check your fasting blood sugar level.  If you are at a normal weight and have a low risk for diabetes, have this test once every three years after 21 years of age.  If you are overweight and have a high risk for diabetes, consider being tested at a younger age or more often. PREVENTING INFECTION  Hepatitis B  If you have a higher risk for hepatitis B, you should be screened for this virus. You are considered at high risk for hepatitis B if:  You were born in a country where hepatitis B is common. Ask your health care provider which countries are considered high risk.  Your parents were born in a high-risk country, and you have not been immunized against hepatitis B (hepatitis B vaccine).  You have HIV or AIDS.  You use needles to inject street drugs.  You live with someone who has hepatitis B.  You have had sex with someone who has hepatitis B.  You get hemodialysis treatment.  You take certain medicines for conditions, including cancer, organ transplantation, and autoimmune conditions. Hepatitis C  Blood testing is recommended for:  Everyone born from 1945 through 1965.  Anyone with known risk factors for hepatitis C. Sexually transmitted infections (STIs)  You should be screened for sexually transmitted infections (STIs) including gonorrhea and chlamydia if:  You are sexually active and are younger than 21 years of age.  You are older than 21 years of age and your health care provider tells you that you are at risk for this type of infection.  Your sexual activity has changed since you were last screened and you are at an increased risk for chlamydia or gonorrhea. Ask your health care provider if you are at risk.  If you do not have HIV, but are at risk, it may be recommended that you take a prescription medicine daily to prevent HIV infection. This is called pre-exposure prophylaxis (PrEP). You are considered at risk  if:  You are sexually active and do not regularly use condoms or know the HIV status of your partner(s).  You take drugs by injection.  You are sexually active with a partner who has HIV. Talk with your health care provider about whether you are at high risk of being infected with HIV. If you choose to begin PrEP, you should first be tested for HIV. You should then be tested every 3   months for as long as you are taking PrEP.  PREGNANCY   If you are premenopausal and you may become pregnant, ask your health care provider about preconception counseling.  If you may become pregnant, take 400 to 800 micrograms (mcg) of folic acid every day.  If you want to prevent pregnancy, talk to your health care provider about birth control (contraception). OSTEOPOROSIS AND MENOPAUSE   Osteoporosis is a disease in which the bones lose minerals and strength with aging. This can result in serious bone fractures. Your risk for osteoporosis can be identified using a bone density scan.  If you are 47 years of age or older, or if you are at risk for osteoporosis and fractures, ask your health care provider if you should be screened.  Ask your health care provider whether you should take a calcium or vitamin D supplement to lower your risk for osteoporosis.  Menopause may have certain physical symptoms and risks.  Hormone replacement therapy may reduce some of these symptoms and risks. Talk to your health care provider about whether hormone replacement therapy is right for you.  HOME CARE INSTRUCTIONS   Schedule regular health, dental, and eye exams.  Stay current with your immunizations.   Do not use any tobacco products including cigarettes, chewing tobacco, or electronic cigarettes.  If you are pregnant, do not drink alcohol.  If you are breastfeeding, limit how much and how often you drink alcohol.  Limit alcohol intake to no more than 1 drink per day for nonpregnant women. One drink equals 12  ounces of beer, 5 ounces of wine, or 1 ounces of hard liquor.  Do not use street drugs.  Do not share needles.  Ask your health care provider for help if you need support or information about quitting drugs.  Tell your health care provider if you often feel depressed.  Tell your health care provider if you have ever been abused or do not feel safe at home.   This information is not intended to replace advice given to you by your health care provider. Make sure you discuss any questions you have with your health care provider.   Document Released: 11/15/2010 Document Revised: 05/23/2014 Document Reviewed: 04/03/2013 Elsevier Interactive Patient Education [employer].

## 2015-08-19 NOTE — Assessment & Plan Note (Signed)
Preventative protocols reviewed and updated unless pt declined. Discussed healthy diet and lifestyle.  

## 2015-08-19 NOTE — Addendum Note (Signed)
Addended by: Littie DeedsEVONTENNO, Tzippy Testerman Y on: 08/19/2015 05:00 PM   Modules accepted: Orders

## 2015-08-19 NOTE — Addendum Note (Signed)
Addended by: Liane ComberHAVERS, Loki Wuthrich C on: 08/19/2015 12:00 PM   Modules accepted: Orders, SmartSet

## 2015-08-20 ENCOUNTER — Other Ambulatory Visit: Payer: Self-pay | Admitting: Family Medicine

## 2015-08-20 DIAGNOSIS — N898 Other specified noninflammatory disorders of vagina: Secondary | ICD-10-CM

## 2015-08-20 LAB — WET PREP BY MOLECULAR PROBE
Candida species: NEGATIVE
Gardnerella vaginalis: POSITIVE — AB
TRICHOMONAS VAG: NEGATIVE

## 2015-08-20 LAB — GC/CHLAMYDIA PROBE AMP
CT Probe RNA: NOT DETECTED
GC Probe RNA: NOT DETECTED

## 2015-08-20 LAB — RPR

## 2015-08-20 LAB — HIV ANTIBODY (ROUTINE TESTING W REFLEX): HIV: NONREACTIVE

## 2015-08-20 LAB — TSH: TSH: 1.15 m[IU]/L

## 2015-08-20 MED ORDER — METRONIDAZOLE 0.75 % VA GEL: 1.0000 | Freq: Every day | VAGINAL | Status: DC

## 2015-08-21 ENCOUNTER — Telehealth: Payer: Self-pay | Admitting: *Deleted

## 2015-08-21 MED ORDER — CLINDAMYCIN PHOSPHATE 2 % VA CREA: 1.0000 | TOPICAL_CREAM | Freq: Every day | VAGINAL | Status: DC

## 2015-08-21 NOTE — Telephone Encounter (Signed)
Patient called and said that metrogel was $70 and she can't afford that. Last time it was $20. Spoke with pharmacy and they said her formulary has changed. Do you want to change meds? Patient knows she won't hear from me until Monday.

## 2015-08-21 NOTE — Telephone Encounter (Signed)
Will prescribe clindamycin cream vaginally once daily x 7 days.  Preventative med would still be metrogel twice weekly, not clindamycin cream.

## 2015-08-24 NOTE — Telephone Encounter (Signed)
Patient notified as instructed by telephone. Patient stated that she has not picked up the script that was sent in the other day and she does not even know the name of it. Patient stated whatever was sent in 08/21/15 cost $70. Patient wants to know if the medication that was sent in on 08/21/15 is the medication that she has to take 4-6 months twice a week once she finished the initial medication?  Patient stated that she does not have any metrogel and if that is what she is to take later she will need it sent to the pharmacy to.

## 2015-08-24 NOTE — Telephone Encounter (Signed)
Left message on voice mail  to call back

## 2015-08-24 NOTE — Telephone Encounter (Signed)
metrogel initially sent in was $70. So i sent in clindamycin cream. Have her price out clindamycin to treat BV. If desires preventative treatment, would be $70 metrogel.

## 2015-08-24 NOTE — Telephone Encounter (Signed)
Returned CMA call.  Best number [phone]

## 2015-08-25 NOTE — Telephone Encounter (Signed)
Patient advised.

## 2016-03-07 ENCOUNTER — Other Ambulatory Visit: Payer: Self-pay

## 2016-03-07 NOTE — Telephone Encounter (Signed)
Pt is requesting this [facility] to [facility] [address], [facility].

## 2016-03-08 MED ORDER — HYDROXYZINE HCL 10 MG PO TABS
5.0000 mg | ORAL_TABLET | Freq: Two times a day (BID) | ORAL | 0 refills | Status: DC | PRN
Start: 2016-03-08 — End: 2016-08-19

## 2016-03-08 NOTE — Telephone Encounter (Signed)
Ok to refill? Last filled 02/19/15 #50 0RF. [facility] [address]

## 2016-08-16 ENCOUNTER — Other Ambulatory Visit: Payer: Self-pay | Admitting: Family Medicine

## 2016-08-16 DIAGNOSIS — N926 Irregular menstruation, unspecified: Secondary | ICD-10-CM

## 2016-08-16 DIAGNOSIS — Z13220 Encounter for screening for lipoid disorders: Secondary | ICD-10-CM

## 2016-08-17 ENCOUNTER — Other Ambulatory Visit (INDEPENDENT_AMBULATORY_CARE_PROVIDER_SITE_OTHER): Payer: 59

## 2016-08-17 ENCOUNTER — Telehealth: Payer: Self-pay | Admitting: Family Medicine

## 2016-08-17 DIAGNOSIS — Z113 Encounter for screening for infections with a predominantly sexual mode of transmission: Secondary | ICD-10-CM | POA: Diagnosis not present

## 2016-08-17 DIAGNOSIS — Z13220 Encounter for screening for lipoid disorders: Secondary | ICD-10-CM | POA: Diagnosis not present

## 2016-08-17 DIAGNOSIS — N926 Irregular menstruation, unspecified: Secondary | ICD-10-CM

## 2016-08-17 LAB — CBC WITH DIFFERENTIAL/PLATELET
BASOS ABS: 0 10*3/uL (ref 0.0–0.1)
Basophils Relative: 0.3 % (ref 0.0–3.0)
EOS PCT: 2.1 % (ref 0.0–5.0)
Eosinophils Absolute: 0.1 10*3/uL (ref 0.0–0.7)
HCT: 38.7 % (ref 36.0–46.0)
HEMOGLOBIN: 13.4 g/dL (ref 12.0–15.0)
Lymphocytes Relative: 14.9 % (ref 12.0–46.0)
Lymphs Abs: 0.9 10*3/uL (ref 0.7–4.0)
MCHC: 34.8 g/dL (ref 30.0–36.0)
MCV: 96.9 fl (ref 78.0–100.0)
MONO ABS: 0.6 10*3/uL (ref 0.1–1.0)
MONOS PCT: 9.7 % (ref 3.0–12.0)
Neutro Abs: 4.2 10*3/uL (ref 1.4–7.7)
Neutrophils Relative %: 73 % (ref 43.0–77.0)
Platelets: 209 10*3/uL (ref 150.0–400.0)
RBC: 3.99 Mil/uL (ref 3.87–5.11)
RDW: 12.2 % (ref 11.5–15.5)
WBC: 5.8 10*3/uL (ref 4.0–10.5)

## 2016-08-17 LAB — LIPID PANEL
Cholesterol: 160 mg/dL (ref 0–200)
HDL: 62.8 mg/dL (ref >39.00)
LDL Cholesterol: 71 mg/dL (ref 0–99)
NONHDL: 97.01
TRIGLYCERIDES: 128 mg/dL (ref 0.0–149.0)
Total CHOL/HDL Ratio: 3
VLDL: 25.6 mg/dL (ref 0.0–40.0)

## 2016-08-17 LAB — TSH: TSH: 0.87 u[IU]/mL (ref 0.35–4.50)

## 2016-08-17 NOTE — Telephone Encounter (Signed)
error 

## 2016-08-18 ENCOUNTER — Ambulatory Visit (INDEPENDENT_AMBULATORY_CARE_PROVIDER_SITE_OTHER): Payer: 59 | Admitting: Family Medicine

## 2016-08-18 ENCOUNTER — Encounter: Payer: Self-pay | Admitting: Family Medicine

## 2016-08-18 VITALS — BP 108/70 | HR 117 | Temp 98.4°F | Wt 117.4 lb

## 2016-08-18 DIAGNOSIS — J101 Influenza due to other identified influenza virus with other respiratory manifestations: Secondary | ICD-10-CM

## 2016-08-18 DIAGNOSIS — R52 Pain, unspecified: Secondary | ICD-10-CM | POA: Diagnosis not present

## 2016-08-18 HISTORY — DX: Influenza due to other identified influenza virus with other respiratory manifestations: J10.1

## 2016-08-18 LAB — GC/CHLAMYDIA PROBE AMP
CT Probe RNA: NOT DETECTED
GC Probe RNA: NOT DETECTED

## 2016-08-18 LAB — POCT INFLUENZA A/B
Influenza A, POC: POSITIVE — AB
Influenza B, POC: NEGATIVE

## 2016-08-18 LAB — RPR

## 2016-08-18 LAB — HIV ANTIBODY (ROUTINE TESTING W REFLEX): HIV: NONREACTIVE

## 2016-08-18 MED ORDER — OSELTAMIVIR PHOSPHATE 75 MG PO CAPS: 75.0000 mg | ORAL_CAPSULE | Freq: Two times a day (BID) | ORAL | 0 refills | Status: DC

## 2016-08-18 NOTE — Progress Notes (Signed)
  [name], MD Phone: [phone]  Shannon Mcdowell is a 21 y.o. female who presents today for same-day visit.  Patient reports onset of symptoms suddenly yesterday. Has sore throat with postnasal drip and sinus congestion. Also has some mild cough productive of green mucus. Does feel congested in her chest and notes minimal shortness of breath. Notes body aches and MAXIMUM TEMPERATURE of 101F. Notes she felt as though she had a sinus headache yesterday. Has no numbness, weakness, or vision changes. She's taken DayQuil with some benefit. Does note she's been exposed to the flu.  PMH: Former smoker   ROS see history of present illness  Objective  Physical Exam Vitals:   08/18/16 1021  BP: 108/70  Pulse: (!) 117  Temp: 98.4 F (36.9 C)    BP Readings from Last 3 Encounters:  08/18/16 108/70  08/19/15 110/70  04/30/15 104/60   Wt Readings from Last 3 Encounters:  08/18/16 117 lb 6.4 oz (53.3 kg)  08/19/15 108 lb 12 oz (49.3 kg)  04/30/15 110 lb 4 oz (50 kg)    Physical Exam  Constitutional: No distress.  HENT:  Head: Normocephalic and atraumatic.  Mild posterior oropharynx erythema, no tonsillar swelling or exudate  Eyes: Conjunctivae are normal. Pupils are equal, round, and reactive to light.  Neck: Neck supple.  Cardiovascular:  Tachycardic, no murmurs rubs or gallops  Pulmonary/Chest: Effort normal and breath sounds normal.  Lymphadenopathy:    She has no cervical adenopathy.  Neurological: She is alert. Gait normal.  Skin: Skin is warm and dry. She is not diaphoretic.     Assessment/Plan: Please see individual problem list.  Influenza A Patient with positive flu test. Symptoms most consistent with this. She is somewhat tachycardic and I believe this is related to her acute illness with influenza. Discussed staying well-hydrated. We will start on Tamiflu. She will rest as well. Work and school note given. Given return precautions. Advised close contact  should contact their physicians to see if they need prophylactic Tamiflu.   Orders Placed This Encounter  Procedures  . POCT Influenza A/B    Meds ordered this encounter  Medications  . oseltamivir (TAMIFLU) 75 MG capsule    Sig: Take 1 capsule (75 mg total) by mouth 2 (two) times daily.    Dispense:  10 capsule    Refill:  0    [name], MD [facility]

## 2016-08-18 NOTE — Patient Instructions (Signed)
Nice to see you. You have influenza. We'll treat you with Tamiflu. If you develop worsening breathing issues or you develop cough productive of blood or persistent fevers or any new symptoms or change in symptoms please seek medical attention immediately.

## 2016-08-18 NOTE — Assessment & Plan Note (Signed)
Patient with positive flu test. Symptoms most consistent with this. She is somewhat tachycardic and I believe this is related to her acute illness with influenza. Discussed staying well-hydrated. We will start on Tamiflu. She will rest as well. Work and school note given. Given return precautions. Advised close contact should contact their physicians to see if they need prophylactic Tamiflu.

## 2016-08-18 NOTE — Progress Notes (Signed)
Pre visit review using our clinic review tool, if applicable. No additional management support is needed unless otherwise documented below in the visit note. 

## 2016-08-19 ENCOUNTER — Ambulatory Visit (INDEPENDENT_AMBULATORY_CARE_PROVIDER_SITE_OTHER): Payer: 59 | Admitting: Family Medicine

## 2016-08-19 ENCOUNTER — Other Ambulatory Visit (HOSPITAL_COMMUNITY)
Admission: RE | Admit: 2016-08-19 | Discharge: 2016-08-19 | Disposition: A | Discharge status: Home / Self Care | Payer: 59 | Source: Ambulatory Visit | Attending: Family Medicine | Admitting: Family Medicine

## 2016-08-19 ENCOUNTER — Encounter: Payer: Self-pay | Admitting: Family Medicine

## 2016-08-19 VITALS — BP 110/70 | HR 101 | Temp 97.8°F | Ht 63.5 in | Wt 115.0 lb

## 2016-08-19 DIAGNOSIS — Z0001 Encounter for general adult medical examination with abnormal findings: Secondary | ICD-10-CM

## 2016-08-19 DIAGNOSIS — Z124 Encounter for screening for malignant neoplasm of cervix: Secondary | ICD-10-CM | POA: Insufficient documentation

## 2016-08-19 DIAGNOSIS — J101 Influenza due to other identified influenza virus with other respiratory manifestations: Secondary | ICD-10-CM

## 2016-08-19 DIAGNOSIS — R51 Headache: Secondary | ICD-10-CM | POA: Diagnosis not present

## 2016-08-19 DIAGNOSIS — F4323 Adjustment disorder with mixed anxiety and depressed mood: Secondary | ICD-10-CM

## 2016-08-19 DIAGNOSIS — Z0000 Encounter for general adult medical examination without abnormal findings: Secondary | ICD-10-CM

## 2016-08-19 DIAGNOSIS — G43009 Migraine without aura, not intractable, without status migrainosus: Secondary | ICD-10-CM

## 2016-08-19 DIAGNOSIS — R519 Headache, unspecified: Secondary | ICD-10-CM

## 2016-08-19 DIAGNOSIS — N898 Other specified noninflammatory disorders of vagina: Secondary | ICD-10-CM

## 2016-08-19 MED ORDER — CYCLOBENZAPRINE HCL 5 MG PO TABS: 2.5000 mg | ORAL_TABLET | Freq: Two times a day (BID) | ORAL | 1 refills | Status: DC | PRN

## 2016-08-19 MED ORDER — HYDROXYZINE HCL 10 MG PO TABS
5.0000 mg | ORAL_TABLET | Freq: Two times a day (BID) | ORAL | 0 refills | Status: DC | PRN
Start: 2016-08-19 — End: 2017-02-21

## 2016-08-19 MED ORDER — METRONIDAZOLE 0.75 % VA GEL: 1.0000 | Freq: Two times a day (BID) | VAGINAL | 0 refills | Status: DC

## 2016-08-19 NOTE — Assessment & Plan Note (Signed)
Preventative protocols reviewed and updated unless pt declined. Discussed healthy diet and lifestyle.  Dx with influenza yesterday - will return in 2 wks for Td.

## 2016-08-19 NOTE — Assessment & Plan Note (Signed)
Continue hydroxyzine 5-10mg  PRN anxiety.

## 2016-08-19 NOTE — Assessment & Plan Note (Signed)
Anticipate recurrent BV - wet prep sent in pap. Start metrogel. Pt requests this over oral flagyl.  Prior regular probiotic use had helped prevent recurrence.

## 2016-08-19 NOTE — Progress Notes (Signed)
BP 110/70 (BP Location: Left Arm, Patient Position: Sitting, Cuff Size: Normal)   Pulse (!) 101   Temp 97.8 F (36.6 C) (Oral)   Ht 5' 3.5" (1.613 m)   Wt 115 lb (52.2 kg)   LMP 07/19/2016   SpO2 98%   BMI 20.05 kg/m    CC: CPE Subjective:    Patient ID: Shannon Mcdowell, female    DOB: [birth year], 21 y.o.   MRN: [id number]  HPI: Shannon Mcdowell is a 21 y.o. female presenting on 08/19/2016 for Annual Exam (Wonders if she needs to see a GYN to find out about why she gets vaginitis)   Seen yesterday at [facility] office with dx influenza A, started on tamiflu. Feeling better today. 2nd flu this season.  Worried she may have another BV infection today. Last one was 1 yr ago. She has done well with probiotic but stopped taking recently.   Migraines may be worsening. Controls with advil - takes 8-12 In a day. Averages 3-4 headaches a week. Avoids soda. Drinks tea and water.   Worried about ADD - requests evaluation for this.   Preventative: Well woman exam - would like today Completed HPV series.  Thinks Tdap around 2008.  Sexually active with women. 2 partners in last 12 months.  LMP 07/19/2016 Seat belt use discussed Sunscreen use discussed. No changing moles on skin.  Non smoker Alcohol - rare  Caffeine: some tea  Lives with room mate and dog Has an older brother. Edu: [employer] studying criminal justice  Occupation: Administrator Activity: no regular exercise  Diet: good water, fruits/vegetables daily, significant sweet tea.  Relevant past medical, surgical, family and social history reviewed and updated as indicated. Interim medical history since our last visit reviewed. Allergies and medications reviewed and updated. Outpatient Medications Prior to Visit  Medication Sig Dispense Refill  . Probiotic Product (PROBIOTIC DAILY PO) Take 1 tablet by mouth daily.    . hydrOXYzine (ATARAX/VISTARIL) 10 MG tablet Take 0.5-1 tablets (5-10 mg total) by mouth 2 (two) times  daily as needed for anxiety. 50 tablet 0  . oseltamivir (TAMIFLU) 75 MG capsule Take 1 capsule (75 mg total) by mouth 2 (two) times daily. 10 capsule 0   No facility-administered medications prior to visit.      Per HPI unless specifically indicated in ROS section below Review of Systems  Constitutional: Positive for fever (flu). Negative for activity change, appetite change, chills, fatigue and unexpected weight change.  HENT: Negative for hearing loss.   Eyes: Positive for visual disturbance (wth headaches).  Respiratory: Positive for shortness of breath (flu). Negative for cough, chest tightness and wheezing.   Cardiovascular: Negative for chest pain, palpitations and leg swelling.  Gastrointestinal: Negative for abdominal distention, abdominal pain, blood in stool, constipation, diarrhea, nausea and vomiting.  Genitourinary: Negative for difficulty urinating and hematuria.  Musculoskeletal: Negative for arthralgias, myalgias and neck pain.  Skin: Negative for rash.  Neurological: Positive for headaches. Negative for dizziness, seizures and syncope.  Hematological: Negative for adenopathy. Does not bruise/bleed easily.  Psychiatric/Behavioral: Negative for dysphoric mood. The patient is not nervous/anxious.        Objective:    BP 110/70 (BP Location: Left Arm, Patient Position: Sitting, Cuff Size: Normal)   Pulse (!) 101   Temp 97.8 F (36.6 C) (Oral)   Ht 5' 3.5" (1.613 m)   Wt 115 lb (52.2 kg)   LMP 07/19/2016   SpO2 98%   BMI 20.05 kg/m  Wt Readings from Last 3 Encounters:  08/19/16 115 lb (52.2 kg)  08/18/16 117 lb 6.4 oz (53.3 kg)  08/19/15 108 lb 12 oz (49.3 kg)    Physical Exam  Constitutional: She is oriented to person, place, and time. She appears well-developed and well-nourished. No distress.  HENT:  Head: Normocephalic and atraumatic.  Right Ear: Hearing, tympanic membrane, external ear and ear canal normal.  Left Ear: Hearing, tympanic membrane, external  ear and ear canal normal.  Nose: Nose normal.  Mouth/Throat: Uvula is midline, oropharynx is clear and moist and mucous membranes are normal. No oropharyngeal exudate, posterior oropharyngeal edema or posterior oropharyngeal erythema.  Eyes: Conjunctivae and EOM are normal. Pupils are equal, round, and reactive to light. No scleral icterus.  Neck: Normal range of motion. Neck supple. No thyromegaly present.  Cardiovascular: Normal rate, regular rhythm, normal heart sounds and intact distal pulses.   No murmur heard. Pulses:      Radial pulses are 2+ on the right side, and 2+ on the left side.  Pulmonary/Chest: Effort normal and breath sounds normal. No respiratory distress. She has no wheezes. She has no rales.  Abdominal: Soft. Bowel sounds are normal. She exhibits no distension and no mass. There is no tenderness. There is no rebound and no guarding.  Genitourinary: Uterus normal. Pelvic exam was performed with patient supine. There is no rash, tenderness or lesion on the right labia. There is no rash, tenderness or lesion on the left labia. Cervix exhibits friability (mild). Cervix exhibits no motion tenderness and no discharge. Right adnexum displays no mass and no tenderness. Left adnexum displays no mass and no tenderness. Vaginal discharge found.  Genitourinary Comments: Cervical erythema present Pap performed on cervix  Musculoskeletal: Normal range of motion. She exhibits no edema.  Lymphadenopathy:    She has no cervical adenopathy.  Neurological: She is alert and oriented to person, place, and time.  CN grossly intact, station and gait intact  Skin: Skin is warm and dry. No rash noted.  Psychiatric: She has a normal mood and affect. Her behavior is normal. Judgment and thought content normal.  Nursing note and vitals reviewed.  Results for orders placed or performed in visit on 08/18/16  POCT Influenza A/B  Result Value Ref Range   Influenza A, POC Positive (A) Negative    Influenza B, POC Negative Negative      Assessment & Plan:   Problem List Items Addressed This Visit    Adjustment disorder with mixed anxiety and depressed mood    Continue hydroxyzine 5-10mg  PRN anxiety.       Generalized headaches    See above      Relevant Medications   cyclobenzaprine (FLEXERIL) 5 MG tablet   Health maintenance examination - Primary    Preventative protocols reviewed and updated unless pt declined. Discussed healthy diet and lifestyle.  Dx with influenza yesterday - will return in 2 wks for Td.      Influenza A    Feeling better, no more fever, taking tamiflu.       Migraine headache    High frequency migraines. She had daith piercings bilaterally 1 week ago to help prevent migraines - still pending results.  I think she may be having medication overuse headaches from high advil use - rec stop advil, start flexeril PRN migraine. If persistent frequent migraines, will discuss ppx migraine treatment.       Relevant Medications   cyclobenzaprine (FLEXERIL) 5 MG tablet   Vaginal  discharge    Anticipate recurrent BV - wet prep sent in pap. Start metrogel. Pt requests this over oral flagyl.  Prior regular probiotic use had helped prevent recurrence.       Relevant Orders   Cytology - PAP [place]    Other Visit Diagnoses    Cervical cancer screening       Relevant Orders   Cytology - PAP [place]       Follow up plan: Return as needed.  Eustaquio Boyden, MD

## 2016-08-19 NOTE — Assessment & Plan Note (Signed)
See above

## 2016-08-19 NOTE — Progress Notes (Signed)
Pre visit review using our clinic review tool, if applicable. No additional management support is needed unless otherwise documented below in the visit note. 

## 2016-08-19 NOTE — Assessment & Plan Note (Signed)
Feeling better, no more fever, taking tamiflu.

## 2016-08-19 NOTE — Patient Instructions (Addendum)
Return for ADD evaluation at your convenience - and tetanus shot at that time.  Stop advil. Start flexeril '5mg'$  1/2-1 tablet as needed for headaches. Let me know if not improving. We could discuss daily medication for migraine prevention.  Pap smear today - wet prep as well  For likely bv - metrogel sent to pharmacy.  Health Maintenance, Female Adopting a healthy lifestyle and getting preventive care can go a long way to promote health and wellness. Talk with your health care provider about what schedule of regular examinations is right for you. This is a good chance for you to check in with your provider about disease prevention and staying healthy. In between checkups, there are plenty of things you can do on your own. Experts have done a lot of research about which lifestyle changes and preventive measures are most likely to keep you healthy. Ask your health care provider for more information. Weight and diet Eat a healthy diet  Be sure to include plenty of vegetables, fruits, low-fat dairy products, and lean protein.  Do not eat a lot of foods high in solid fats, added sugars, or salt.  Get regular exercise. This is one of the most important things you can do for your health.  Most adults should exercise for at least 150 minutes each week. The exercise should increase your heart rate and make you sweat (moderate-intensity exercise).  Most adults should also do strengthening exercises at least twice a week. This is in addition to the moderate-intensity exercise. Maintain a healthy weight  Body mass index (BMI) is a measurement that can be used to identify possible weight problems. It estimates body fat based on height and weight. Your health care provider can help determine your BMI and help you achieve or maintain a healthy weight.  For females 23 years of age and older:  A BMI below 18.5 is considered underweight.  A BMI of 18.5 to 24.9 is normal.  A BMI of 25 to 29.9 is considered  overweight.  A BMI of 30 and above is considered obese. Watch levels of cholesterol and blood lipids  You should start having your blood tested for lipids and cholesterol at 22 years of age, then have this test every 5 years.  You may need to have your cholesterol levels checked more often if:  Your lipid or cholesterol levels are high.  You are older than 22 years of age.  You are at high risk for heart disease. Cancer screening Lung Cancer  Lung cancer screening is recommended for adults 37-27 years old who are at high risk for lung cancer because of a history of smoking.  A yearly low-dose CT scan of the lungs is recommended for people who:  Currently smoke.  Have quit within the past 15 years.  Have at least a 30-pack-year history of smoking. A pack year is smoking an average of one pack of cigarettes a day for 1 year.  Yearly screening should continue until it has been 15 years since you quit.  Yearly screening should stop if you develop a health problem that would prevent you from having lung cancer treatment. Breast Cancer  Practice breast self-awareness. This means understanding how your breasts normally appear and feel.  It also means doing regular breast self-exams. Let your health care provider know about any changes, no matter how small.  If you are in your 20s or 30s, you should have a clinical breast exam (CBE) by a health care provider every 1-3  years as part of a regular health exam.  If you are 63 or older, have a CBE every year. Also consider having a breast X-ray (mammogram) every year.  If you have a family history of breast cancer, talk to your health care provider about genetic screening.  If you are at high risk for breast cancer, talk to your health care provider about having an MRI and a mammogram every year.  Breast cancer gene (BRCA) assessment is recommended for women who have family members with BRCA-related cancers. BRCA-related cancers  include:  Breast.  Ovarian.  Tubal.  Peritoneal cancers.  Results of the assessment will determine the need for genetic counseling and BRCA1 and BRCA2 testing. Cervical Cancer  Your health care provider may recommend that you be screened regularly for cancer of the pelvic organs (ovaries, uterus, and vagina). This screening involves a pelvic examination, including checking for microscopic changes to the surface of your cervix (Pap test). You may be encouraged to have this screening done every 3 years, beginning at age 73.  For women ages 11-65, health care providers may recommend pelvic exams and Pap testing every 3 years, or they may recommend the Pap and pelvic exam, combined with testing for human papilloma virus (HPV), every 5 years. Some types of HPV increase your risk of cervical cancer. Testing for HPV may also be done on women of any age with unclear Pap test results.  Other health care providers may not recommend any screening for nonpregnant women who are considered low risk for pelvic cancer and who do not have symptoms. Ask your health care provider if a screening pelvic exam is right for you.  If you have had past treatment for cervical cancer or a condition that could lead to cancer, you need Pap tests and screening for cancer for at least 20 years after your treatment. If Pap tests have been discontinued, your risk factors (such as having a new sexual partner) need to be reassessed to determine if screening should resume. Some women have medical problems that increase the chance of getting cervical cancer. In these cases, your health care provider may recommend more frequent screening and Pap tests. Colorectal Cancer  This type of cancer can be detected and often prevented.  Routine colorectal cancer screening usually begins at 22 years of age and continues through 22 years of age.  Your health care provider may recommend screening at an earlier age if you have risk factors  for colon cancer.  Your health care provider may also recommend using home test kits to check for hidden blood in the stool.  A small camera at the end of a tube can be used to examine your colon directly (sigmoidoscopy or colonoscopy). This is done to check for the earliest forms of colorectal cancer.  Routine screening usually begins at age 45.  Direct examination of the colon should be repeated every 5-10 years through 22 years of age. However, you may need to be screened more often if early forms of precancerous polyps or small growths are found. Skin Cancer  Check your skin from head to toe regularly.  Tell your health care provider about any new moles or changes in moles, especially if there is a change in a mole's shape or color.  Also tell your health care provider if you have a mole that is larger than the size of a pencil eraser.  Always use sunscreen. Apply sunscreen liberally and repeatedly throughout the day.  Protect yourself by wearing  long sleeves, pants, a wide-brimmed hat, and sunglasses whenever you are outside. Heart disease, diabetes, and high blood pressure  High blood pressure causes heart disease and increases the risk of stroke. High blood pressure is more likely to develop in:  People who have blood pressure in the high end of the normal range (130-139/85-89 mm Hg).  People who are overweight or obese.  People who are African American.  If you are 21-19 years of age, have your blood pressure checked every 3-5 years. If you are 54 years of age or older, have your blood pressure checked every year. You should have your blood pressure measured twice-once when you are at a hospital or clinic, and once when you are not at a hospital or clinic. Record the average of the two measurements. To check your blood pressure when you are not at a hospital or clinic, you can use:  An automated blood pressure machine at a pharmacy.  A home blood pressure monitor.  If you  are between 59 years and 23 years old, ask your health care provider if you should take aspirin to prevent strokes.  Have regular diabetes screenings. This involves taking a blood sample to check your fasting blood sugar level.  If you are at a normal weight and have a low risk for diabetes, have this test once every three years after 22 years of age.  If you are overweight and have a high risk for diabetes, consider being tested at a younger age or more often. Preventing infection Hepatitis B  If you have a higher risk for hepatitis B, you should be screened for this virus. You are considered at high risk for hepatitis B if:  You were born in a country where hepatitis B is common. Ask your health care provider which countries are considered high risk.  Your parents were born in a high-risk country, and you have not been immunized against hepatitis B (hepatitis B vaccine).  You have HIV or AIDS.  You use needles to inject street drugs.  You live with someone who has hepatitis B.  You have had sex with someone who has hepatitis B.  You get hemodialysis treatment.  You take certain medicines for conditions, including cancer, organ transplantation, and autoimmune conditions. Hepatitis C  Blood testing is recommended for:  Everyone born from [birth year] through 1966.  Anyone with known risk factors for hepatitis C. Sexually transmitted infections (STIs)  You should be screened for sexually transmitted infections (STIs) including gonorrhea and chlamydia if:  You are sexually active and are younger than 22 years of age.  You are older than 22 years of age and your health care provider tells you that you are at risk for this type of infection.  Your sexual activity has changed since you were last screened and you are at an increased risk for chlamydia or gonorrhea. Ask your health care provider if you are at risk.  If you do not have HIV, but are at risk, it may be recommended that you  take a prescription medicine daily to prevent HIV infection. This is called pre-exposure prophylaxis (PrEP). You are considered at risk if:  You are sexually active and do not regularly use condoms or know the HIV status of your partner(s).  You take drugs by injection.  You are sexually active with a partner who has HIV. Talk with your health care provider about whether you are at high risk of being infected with HIV. If you choose to  begin PrEP, you should first be tested for HIV. You should then be tested every 3 months for as long as you are taking PrEP. Pregnancy  If you are premenopausal and you may become pregnant, ask your health care provider about preconception counseling.  If you may become pregnant, take 400 to 800 micrograms (mcg) of folic acid every day.  If you want to prevent pregnancy, talk to your health care provider about birth control (contraception). Osteoporosis and menopause  Osteoporosis is a disease in which the bones lose minerals and strength with aging. This can result in serious bone fractures. Your risk for osteoporosis can be identified using a bone density scan.  If you are 65 years of age or older, or if you are at risk for osteoporosis and fractures, ask your health care provider if you should be screened.  Ask your health care provider whether you should take a calcium or vitamin D supplement to lower your risk for osteoporosis.  Menopause may have certain physical symptoms and risks.  Hormone replacement therapy may reduce some of these symptoms and risks. Talk to your health care provider about whether hormone replacement therapy is right for you. Follow these instructions at home:  Schedule regular health, dental, and eye exams.  Stay current with your immunizations.  Do not use any tobacco products including cigarettes, chewing tobacco, or electronic cigarettes.  If you are pregnant, do not drink alcohol.  If you are breastfeeding, limit  how much and how often you drink alcohol.  Limit alcohol intake to no more than 1 drink per day for nonpregnant women. One drink equals 12 ounces of beer, 5 ounces of wine, or 1 ounces of hard liquor.  Do not use street drugs.  Do not share needles.  Ask your health care provider for help if you need support or information about quitting drugs.  Tell your health care provider if you often feel depressed.  Tell your health care provider if you have ever been abused or do not feel safe at home. This information is not intended to replace advice given to you by your health care provider. Make sure you discuss any questions you have with your health care provider. Document Released: 11/15/2010 Document Revised: 10/08/2015 Document Reviewed: 02/03/2015 Elsevier Interactive Patient Education  2017 [employer].

## 2016-08-19 NOTE — Assessment & Plan Note (Signed)
High frequency migraines. She had daith piercings bilaterally 1 week ago to help prevent migraines - still pending results.  I think she may be having medication overuse headaches from high advil use - rec stop advil, start flexeril PRN migraine. If persistent frequent migraines, will discuss ppx migraine treatment.

## 2016-08-20 LAB — HSV TYPE I/II IGG, IGMW/ REFLEX
HSV 1 GLYCOPROTEIN G AB, IGG: 21.3 {index} — AB (ref <0.90)
HSV 1 IGM SCREEN: NEGATIVE
HSV 2 Glycoprotein G Ab, IgG: <0.9 Index (ref <0.90)
HSV 2 IGM SCREEN: NEGATIVE

## 2016-08-23 LAB — CYTOLOGY - PAP
Bacterial vaginitis: POSITIVE — AB
Candida vaginitis: NEGATIVE
DIAGNOSIS: NEGATIVE
HPV: NOT DETECTED
TRICH (WINDOWPATH): NEGATIVE

## 2016-09-02 ENCOUNTER — Encounter: Payer: Self-pay | Admitting: Family Medicine

## 2016-09-02 ENCOUNTER — Ambulatory Visit (INDEPENDENT_AMBULATORY_CARE_PROVIDER_SITE_OTHER): Payer: 59 | Admitting: Family Medicine

## 2016-09-02 VITALS — BP 114/60 | HR 87 | Temp 98.2°F | Wt 116.5 lb

## 2016-09-02 DIAGNOSIS — Z23 Encounter for immunization: Secondary | ICD-10-CM

## 2016-09-02 DIAGNOSIS — F4323 Adjustment disorder with mixed anxiety and depressed mood: Secondary | ICD-10-CM | POA: Diagnosis not present

## 2016-09-02 DIAGNOSIS — N898 Other specified noninflammatory disorders of vagina: Secondary | ICD-10-CM

## 2016-09-02 DIAGNOSIS — Z20828 Contact with and (suspected) exposure to other viral communicable diseases: Secondary | ICD-10-CM | POA: Diagnosis not present

## 2016-09-02 DIAGNOSIS — R41840 Attention and concentration deficit: Secondary | ICD-10-CM | POA: Diagnosis not present

## 2016-09-02 NOTE — Assessment & Plan Note (Signed)
After recent influenza A. Reviewed universal and droplet precautions. Recent tamiflu - don't think she needs re-treatment.

## 2016-09-02 NOTE — Assessment & Plan Note (Signed)
h/o recurrent BV, latest infection was not recurrent. Currently sxs largely resolved - discussed possible suppressive therapy if recurrent.

## 2016-09-02 NOTE — Patient Instructions (Addendum)
Tdap today. I think you are doing well.  Some inattention but not to severity of diagnosis of adult attention deficit disorder.  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

## 2016-09-02 NOTE — Progress Notes (Signed)
BP 114/60   Pulse 87   Temp 98.2 F (36.8 C) (Oral)   Wt 116 lb 8 oz (52.8 kg)   LMP 07/19/2016   SpO2 99%   BMI 20.31 kg/m    CC: ADHD f/u Subjective:    Patient ID: Shannon Mcdowell, female    DOB: Nov 13, 1995, 21 y.o.   MRN: [id number]  HPI: Shannon Mcdowell is a 21 y.o. female presenting on 09/02/2016 for ADD (evaluation)   Recurrent BV - treated with metrogel and symptoms have resolved (oral flagyl caused GI upset).   Roommate just got the flu. Pt had influenza A 2 wks ago treated with 5d tamiflu.   [employer] - studying criminal justice, to graduate this summer. Working for lawyer currently.   Here for ADHD evaluation. Longstanding history of difficulty focusing especially noticed at class. Easily zones out at school. No significant trouble at work or at home. Ongoing for about 6 months. Started since diagnosis of chronic migraines (2016).   Pt endorses persistent pattern of inattention that doesn't significantly interfere with daily functioning. Symptoms present in 1 setting. Symptoms present before 21 years of age? Yes - tested and borderline ADHD in grade school.  Inattention (6+, 6 months): Careless mistakes? sometimes Difficulty sustaining attention? yes Doesn't listen when spoken to directly? yes Lacks follow through with instructions or difficulty completing tasks? no Difficulty organizing tasks/activities? no Avoiding tasks that require sustained attention? yes Easily losing things needed for tasks? no Easily distracted by external stimuli? yes Forgetful in daily activities? yes Hyperactive/impulsive (6+, 6 months): Fidgeting, tapping hands/feet? yes Leaves seat when expected to remain in seat? no Feeling restless, or running around when not expected? no Unable to engage in leisurely activities quietly? no Always on the go, "driven by a motor"? no Excessive talking? no Blurting out answer, responds to other's questions? yes Difficulty waiting in line or waiting  turn? yes Interrupting or intruding on others? sometimes   Relevant past medical, surgical, family and social history reviewed and updated as indicated. Interim medical history since our last visit reviewed. Allergies and medications reviewed and updated. Outpatient Medications Prior to Visit  Medication Sig Dispense Refill  . cyclobenzaprine (FLEXERIL) 5 MG tablet Take 0.5-1 tablets (2.5-5 mg total) by mouth 2 (two) times daily as needed (migraine headache - take at onset, sedation precautions). 30 tablet 1  . hydrOXYzine (ATARAX/VISTARIL) 10 MG tablet Take 0.5-1 tablets (5-10 mg total) by mouth 2 (two) times daily as needed for anxiety. 30 tablet 0  . Probiotic Product (PROBIOTIC DAILY PO) Take 1 tablet by mouth daily.    . metroNIDAZOLE (METROGEL VAGINAL) 0.75 % vaginal gel Place 1 Applicatorful vaginally 2 (two) times daily. 70 g 0   No facility-administered medications prior to visit.      Per HPI unless specifically indicated in ROS section below Review of Systems     Objective:    BP 114/60   Pulse 87   Temp 98.2 F (36.8 C) (Oral)   Wt 116 lb 8 oz (52.8 kg)   LMP 07/19/2016   SpO2 99%   BMI 20.31 kg/m   Wt Readings from Last 3 Encounters:  09/02/16 116 lb 8 oz (52.8 kg)  08/19/16 115 lb (52.2 kg)  08/18/16 117 lb 6.4 oz (53.3 kg)    Physical Exam  Constitutional: She appears well-developed and well-nourished. No distress.  HENT:  Mouth/Throat: Oropharynx is clear and moist. No oropharyngeal exudate.  Neck: No thyromegaly present.  Lymphadenopathy:  She has no cervical adenopathy.  Psychiatric: She has a normal mood and affect. Her behavior is normal. Judgment and thought content normal.  Nursing note and vitals reviewed.  Results for orders placed or performed in visit on 08/19/16  Cytology - PAP [place]  Result Value Ref Range   Adequacy      Satisfactory for evaluation  endocervical/transformation zone component PRESENT.   Diagnosis      NEGATIVE  FOR INTRAEPITHELIAL LESIONS OR MALIGNANCY.   HPV NOT DETECTED    Bacterial vaginitis POSITIVE for Gardnerella vaginalis (A)    Candida vaginitis Negative for Candida species    Trichomonas Negative    Material Submitted CervicoVaginal Pap [ThinPrep Imaged]    CYTOLOGY - PAP PAP RESULT    PHQ9 = 8, somewhat difficult to function. No further SI/HI.  GAD7 = 4 MDQ - screen negative.    Assessment & Plan:   Problem List Items Addressed This Visit    Adjustment disorder with mixed anxiety and depressed mood    Normal PHQ9, GAD7, MDQ screens today.  No indication for further treatment at this time.       Exposure to the flu    After recent influenza A. Reviewed universal and droplet precautions. Recent tamiflu - don't think she needs re-treatment.       Inattention - Primary    Reviewed she does not have severe enough symptoms to recommend medication at this time as symptoms are not interfering with school performance.  She would not be interested in stimulant route (with psychological eval) and overall not too interested in medication at all. Mainly wanted to have evaluation to determine degree of inattention.  Reviewed learning optimal study techniques for her - she thinks she learns better after personal demonstration.       Vaginal discharge    h/o recurrent BV, latest infection was not recurrent. Currently sxs largely resolved - discussed possible suppressive therapy if recurrent.        Other Visit Diagnoses    Need for Tdap vaccination       Relevant Orders   Tdap vaccine greater than or equal to 7yo IM (Completed)       Follow up plan: No Follow-up on file.  Eustaquio Boyden, MD

## 2016-09-02 NOTE — Progress Notes (Signed)
Pre visit review using our clinic review tool, if applicable. No additional management support is needed unless otherwise documented below in the visit note. 

## 2016-09-02 NOTE — Assessment & Plan Note (Signed)
Normal PHQ9, GAD7, MDQ screens today.  No indication for further treatment at this time.

## 2016-09-02 NOTE — Assessment & Plan Note (Addendum)
Reviewed she does not have severe enough symptoms to recommend medication at this time as symptoms are not interfering with school performance.  She would not be interested in stimulant route (with psychological eval) and overall not too interested in medication at all. Mainly wanted to have evaluation to determine degree of inattention.  Reviewed learning optimal study techniques for her - she thinks she learns better after personal demonstration.

## 2016-10-18 ENCOUNTER — Encounter: Payer: Self-pay | Admitting: Family Medicine

## 2016-12-16 ENCOUNTER — Encounter: Payer: Self-pay | Admitting: Family Medicine

## 2016-12-16 MED ORDER — METRONIDAZOLE 0.75 % VA GEL: 1.0000 | Freq: Two times a day (BID) | VAGINAL | 0 refills | Status: DC

## 2017-02-20 ENCOUNTER — Encounter: Payer: Self-pay | Admitting: Family Medicine

## 2017-02-21 ENCOUNTER — Other Ambulatory Visit: Payer: Self-pay

## 2017-02-21 MED ORDER — CYCLOBENZAPRINE HCL 5 MG PO TABS: 2.5000 mg | ORAL_TABLET | Freq: Two times a day (BID) | ORAL | 1 refills | Status: DC | PRN

## 2017-02-21 MED ORDER — HYDROXYZINE HCL 10 MG PO TABS: 5.0000 mg | ORAL_TABLET | Freq: Two times a day (BID) | ORAL | 1 refills | Status: DC | PRN

## 2017-04-21 ENCOUNTER — Encounter: Payer: Self-pay | Admitting: Family Medicine

## 2017-04-21 ENCOUNTER — Ambulatory Visit (INDEPENDENT_AMBULATORY_CARE_PROVIDER_SITE_OTHER): Payer: 59 | Admitting: Family Medicine

## 2017-04-21 VITALS — BP 122/68 | HR 81 | Temp 98.2°F | Ht 64.0 in | Wt 121.0 lb

## 2017-04-21 DIAGNOSIS — R300 Dysuria: Secondary | ICD-10-CM

## 2017-04-21 DIAGNOSIS — N898 Other specified noninflammatory disorders of vagina: Secondary | ICD-10-CM

## 2017-04-21 LAB — POC URINALSYSI DIPSTICK (AUTOMATED)
BILIRUBIN UA: NEGATIVE
Blood, UA: NEGATIVE
GLUCOSE UA: NEGATIVE
KETONES UA: NEGATIVE
LEUKOCYTES UA: NEGATIVE
Nitrite, UA: NEGATIVE
Protein, UA: NEGATIVE
SPEC GRAV UA: 1.025 (ref 1.010–1.025)
Urobilinogen, UA: 0.2 E.U./dL
pH, UA: 6 (ref 5.0–8.0)

## 2017-04-21 NOTE — Progress Notes (Signed)
BP 122/68 (BP Location: Left Arm, Patient Position: Sitting, Cuff Size: Normal)   Pulse 81   Temp 98.2 F (36.8 C) (Oral)   Ht 5\' 4"  (1.626 m)   Wt 121 lb (54.9 kg)   LMP 04/05/2017   SpO2 99%   BMI 20.77 kg/m    CC: dsyuria, vag discharge Subjective:    Patient ID: Shannon Mcdowell, female    DOB: [birth year], 21 y.o.   MRN: [id number]  HPI: Shannon Mcdowell is a 21 y.o. female presenting on 04/21/2017 for Annual Exam (Requests labs for STDs)   Intermittent dysuria for last few weeks along with some lower abdominal discomfort. Denies hematuria, urgency, frequency, nausea/vomiting, fevers. Some diarrhea  No change to small amt vaginal discharge present.   H/o recurrent BV - treated with metrogel and symptoms have resolved (oral flagyl caused GI upset).   LMP 04/05/2017.   Relevant past medical, surgical, family and social history reviewed and updated as indicated. Interim medical history since our last visit reviewed. Allergies and medications reviewed and updated. Outpatient Medications Prior to Visit  Medication Sig Dispense Refill  . cyclobenzaprine (FLEXERIL) 5 MG tablet Take 0.5-1 tablets (2.5-5 mg total) by mouth 2 (two) times daily as needed (migraine headache - take at onset, sedation precautions). 30 tablet 1  . hydrOXYzine (ATARAX/VISTARIL) 10 MG tablet Take 0.5-1 tablets (5-10 mg total) by mouth 2 (two) times daily as needed for anxiety. 30 tablet 1  . Probiotic Product (PROBIOTIC DAILY PO) Take 1 tablet by mouth daily.    . metroNIDAZOLE (METROGEL VAGINAL) 0.75 % vaginal gel Place 1 Applicatorful vaginally 2 (two) times daily. 70 g 0   No facility-administered medications prior to visit.      Per HPI unless specifically indicated in ROS section below Review of Systems     Objective:    BP 122/68 (BP Location: Left Arm, Patient Position: Sitting, Cuff Size: Normal)   Pulse 81   Temp 98.2 F (36.8 C) (Oral)   Ht 5\' 4"  (1.626 m)   Wt 121 lb (54.9 kg)   LMP  04/05/2017   SpO2 99%   BMI 20.77 kg/m   Wt Readings from Last 3 Encounters:  04/21/17 121 lb (54.9 kg)  09/02/16 116 lb 8 oz (52.8 kg)  08/19/16 115 lb (52.2 kg)    Physical Exam  Constitutional: She appears well-developed and well-nourished. No distress.  Genitourinary: Pelvic exam was performed with patient supine. There is no rash, tenderness, lesion or injury on the right labia. There is no rash, tenderness, lesion or injury on the left labia. No erythema or bleeding in the vagina. No signs of injury around the vagina. Vaginal discharge (thin) found.  Genitourinary Comments: Wet prep and CT/GC collected  Nursing note and vitals reviewed.  Results for orders placed or performed in visit on 04/21/17  POCT Urinalysis Dipstick (Automated)  Result Value Ref Range   Color, UA yellow    Clarity, UA cloudy    Glucose, UA negative    Bilirubin, UA negative    Ketones, UA negative    Spec Grav, UA 1.025 1.010 - 1.025   Blood, UA negative    pH, UA 6.0 5.0 - 8.0   Protein, UA negative    Urobilinogen, UA 0.2 0.2 or 1.0 E.U./dL   Nitrite, UA negative    Leukocytes, UA Negative Negative      Assessment & Plan:   Problem List Items Addressed This Visit    Vaginal discharge -  Primary    Update wet prep today. ?BV. Will await results prior to treatment.  UA normal today.       Relevant Orders   C. trachomatis/N. gonorrhoeae RNA   WET PREP BY MOLECULAR PROBE    Other Visit Diagnoses    Dysuria       Relevant Orders   POCT Urinalysis Dipstick (Automated) (Completed)       Follow up plan: Return if symptoms worsen or fail to improve.  Eustaquio BoydenJavier Blake Goya, MD

## 2017-04-21 NOTE — Patient Instructions (Signed)
Wet prep and chlamydia/gonorrhea collected today.  We will be in touch with results. Continue probiotic. Good to see you, return after April for physical.

## 2017-04-21 NOTE — Assessment & Plan Note (Addendum)
Update wet prep today. ?BV. Will await results prior to treatment.  UA normal today.

## 2017-04-22 ENCOUNTER — Other Ambulatory Visit: Payer: Self-pay | Admitting: Family Medicine

## 2017-04-22 DIAGNOSIS — N898 Other specified noninflammatory disorders of vagina: Secondary | ICD-10-CM

## 2017-04-22 LAB — WET PREP BY MOLECULAR PROBE
CANDIDA SPECIES: DETECTED — AB
MICRO NUMBER:: 81379670
SPECIMEN QUALITY:: ADEQUATE
TRICHOMONAS VAG: NOT DETECTED

## 2017-04-22 LAB — C. TRACHOMATIS/N. GONORRHOEAE RNA
C. trachomatis RNA, TMA: NOT DETECTED
N. GONORRHOEAE RNA, TMA: NOT DETECTED

## 2017-04-22 MED ORDER — FLUCONAZOLE 150 MG PO TABS: 150.0000 mg | ORAL_TABLET | Freq: Once | ORAL | 0 refills | Status: AC

## 2017-04-22 MED ORDER — METRONIDAZOLE 0.75 % VA GEL: 1.0000 | Freq: Two times a day (BID) | VAGINAL | 0 refills | Status: DC

## 2017-07-03 ENCOUNTER — Encounter: Payer: Self-pay | Admitting: Family Medicine

## 2017-07-03 DIAGNOSIS — N946 Dysmenorrhea, unspecified: Secondary | ICD-10-CM

## 2017-07-03 DIAGNOSIS — N926 Irregular menstruation, unspecified: Secondary | ICD-10-CM

## 2017-07-06 MED ORDER — METRONIDAZOLE 0.75 % VA GEL: 1.0000 | Freq: Every day | VAGINAL | 4 refills | Status: DC

## 2017-07-06 NOTE — Telephone Encounter (Signed)
Rx metrogel treatment course then preventatively twice weekly x 3 months, update us with effect. Will refer to GYN to discuss dysmenorrhea and irregular periods.

## 2017-07-11 ENCOUNTER — Telehealth: Payer: Self-pay | Admitting: Family Medicine

## 2017-07-11 NOTE — Telephone Encounter (Addendum)
Spoke with pt relaying instructions per Dr. Theo DillsG. Verbalizes understanding. Pt states her last period was 03/2017.  Says she used first dose of gel last night and is now releasing dark clots.  Has appt with OB/GYN 07/17/17 at 2:50.

## 2017-07-11 NOTE — Telephone Encounter (Signed)
Patient called to inform Dr. Gutierrez that she used the metrogel last night, then today she started bleeding. Is this normal and should she stop or continue using the metrogel. I advised this would be sent over and someone will be in touch with her later today with the answer. 

## 2017-07-11 NOTE — Telephone Encounter (Signed)
This encounter was created in error - please disregard.

## 2017-07-11 NOTE — Telephone Encounter (Signed)
When was LMP? Is she currently on period? metrogel should not cause bleeding. rec hold metrogel. If ongoing bleed and not on period rec come in for evaluation.

## 2017-07-11 NOTE — Telephone Encounter (Deleted)
Patient called to inform Dr. Sharen HonesGutierrez that she used the metrogel last night, then today she started bleeding. Is this normal and should she stop or continue using the metrogel. I advised this would be sent over and someone will be in touch with her later today with the answer.

## 2017-07-12 NOTE — Telephone Encounter (Signed)
So is she coming in sooner for evaluation or did she want to wait until OBGYN eval 3/4?

## 2017-07-12 NOTE — Telephone Encounter (Addendum)
Spoke with pt and she says she will wait to see OB/GYN on Mon.

## 2017-07-17 ENCOUNTER — Encounter: Payer: Self-pay | Admitting: Obstetrics and Gynecology

## 2017-07-17 ENCOUNTER — Ambulatory Visit (INDEPENDENT_AMBULATORY_CARE_PROVIDER_SITE_OTHER): Payer: 59 | Admitting: Obstetrics and Gynecology

## 2017-07-17 VITALS — BP 120/80 | HR 103 | Ht 66.0 in | Wt 123.0 lb

## 2017-07-17 DIAGNOSIS — B9689 Other specified bacterial agents as the cause of diseases classified elsewhere: Secondary | ICD-10-CM

## 2017-07-17 DIAGNOSIS — Z3009 Encounter for other general counseling and advice on contraception: Secondary | ICD-10-CM | POA: Diagnosis not present

## 2017-07-17 DIAGNOSIS — N946 Dysmenorrhea, unspecified: Secondary | ICD-10-CM | POA: Diagnosis not present

## 2017-07-17 DIAGNOSIS — N760 Acute vaginitis: Secondary | ICD-10-CM | POA: Diagnosis not present

## 2017-07-17 DIAGNOSIS — N926 Irregular menstruation, unspecified: Secondary | ICD-10-CM | POA: Diagnosis not present

## 2017-07-17 LAB — POCT WET PREP WITH KOH
CLUE CELLS WET PREP PER HPF POC: POSITIVE
KOH Prep POC: POSITIVE — AB
TRICHOMONAS UA: NEGATIVE
Yeast Wet Prep HPF POC: NEGATIVE

## 2017-07-17 MED ORDER — CLINDAMYCIN PHOSPHATE 2 % VA CREA: 1.0000 | TOPICAL_CREAM | Freq: Every day | VAGINAL | 0 refills | Status: DC

## 2017-07-17 NOTE — Patient Instructions (Signed)
I value your feedback and entrusting us with your care. If you get a [facility] patient survey, I would appreciate you taking the time to let us know about your experience today. Thank you! 

## 2017-07-17 NOTE — Progress Notes (Signed)
Chief Complaint  Patient presents with  . Menstrual Problem    HPI:      Ms. Shannon Mcdowell is a 22 y.o. [id number] who LMP was Patient's last menstrual period was 07/11/2017., presents today for NP eval of irreg menses. Menses are usually Q2-3 months, last 5 days, med flow, with dysmen, relieved usually with NSAIDs/heating pad. Has had to miss work due to dysmen before. Also has mid cycle pelvic cramping and thinks she has endometriosis. Pt had menses 11/18 but then not again until 07/11/17, which was concerning to pt. It was heavy and lasted 4 days. She is not sex active. She did OCPs about 2013 but didn't take them regularly, so didn't have good cycle control. She is unsure if she wants [facility] now for cycles. No hx of HTN, tobacco use, DVTs.  She also has a hx of recurrent BV for the past couple of yrs. She is treated by PCP with metrogel or flagyl tabs with temporary relief. She has sx today. She treated with metrogel for 1 dose and then started 07/11/17 bleeding. She has d/c with odor, no irritation. She uses dove sensitive skin soap and dryer sheets. She is taking probiotics.   Had neg gon/chlam testing with PCP 12/18.   Past Medical History:  Diagnosis Date  . Depression with anxiety   . Generalized headaches    occasional  . GERD (gastroesophageal reflux disease)   . History of chicken pox   . Hx of migraines     History reviewed. No pertinent surgical history.  Family History  Problem Relation Age of Onset  . Cancer Paternal Grandfather        leukemia  . Cancer Paternal Grandmother        breast& lung  . Hypertension Father   . CAD Other        mat great grandmother  . Diabetes Maternal Grandfather   . Mental illness Maternal Grandfather        suicide (shot himself)  . Anxiety disorder Brother   . Stroke Neg Hx     Social History   Socioeconomic History  . Marital status: Single    Spouse name: Not on file  . Number of children: Not on file  . Years of  education: Not on file  . Highest education level: Not on file  Social Needs  . Financial resource strain: Not on file  . Food insecurity - worry: Not on file  . Food insecurity - inability: Not on file  . Transportation needs - medical: Not on file  . Transportation needs - non-medical: Not on file  Occupational History  . Not on file  Tobacco Use  . Smoking status: Former Games developer  . Smokeless tobacco: Never Used  Substance and Sexual Activity  . Alcohol use: Yes    Alcohol/week: 0.0 oz    Comment: social   . Drug use: Yes    Comment: h/o MJ  . Sexual activity: Yes    Partners: Female    Birth control/protection: None  Other Topics Concern  . Not on file  Social History Narrative   Caffeine: lots of mountain dew/pepsi   Lives with father alternating with mother.     Older brother.   Occupation: Airline pilot   Activity: no regular exercise   Diet: good water, fruits/vegetables daily     Current Outpatient Medications:  .  cyclobenzaprine (FLEXERIL) 5 MG tablet, Take 0.5-1 tablets (2.5-5 mg total) by mouth 2 (two) times daily  as needed (migraine headache - take at onset, sedation precautions)., Disp: 30 tablet, Rfl: 1 .  hydrOXYzine (ATARAX/VISTARIL) 10 MG tablet, Take 0.5-1 tablets (5-10 mg total) by mouth 2 (two) times daily as needed for anxiety., Disp: 30 tablet, Rfl: 1 .  Probiotic Product (PROBIOTIC DAILY PO), Take 1 tablet by mouth daily., Disp: , Rfl:  .  clindamycin (CLEOCIN) 2 % vaginal cream, Place 1 Applicatorful vaginally at bedtime., Disp: 40 g, Rfl: 0 .  metroNIDAZOLE (METROGEL VAGINAL) 0.75 % vaginal gel, Place 1 Applicatorful vaginally at bedtime. For 5 days then preventatively twice weekly for 3 months (Patient not taking: Reported on 07/17/2017), Disp: 70 g, Rfl: 4   ROS:  Review of Systems  Constitutional: Negative for fever.  Gastrointestinal: Negative for blood in stool, constipation, diarrhea, nausea and vomiting.  Genitourinary: Positive for menstrual  problem and vaginal discharge. Negative for dyspareunia, dysuria, flank pain, frequency, hematuria, urgency, vaginal bleeding and vaginal pain.  Musculoskeletal: Negative for back pain.  Skin: Negative for rash.  Neurological: Positive for headaches.     OBJECTIVE:   Vitals:  BP 120/80   Pulse (!) 103   Ht 5\' 6"  (1.676 m)   Wt 123 lb (55.8 kg)   LMP 07/11/2017   BMI 19.85 kg/m   Physical Exam  Constitutional: She is oriented to person, place, and time and well-developed, well-nourished, and in no distress. Vital signs are normal.  Pulmonary/Chest: Effort normal.  Genitourinary: Vagina normal, uterus normal, cervix normal, right adnexa normal, left adnexa normal and vulva normal. Uterus is not enlarged. Cervix exhibits no motion tenderness and no tenderness. Right adnexum displays no mass and no tenderness. Left adnexum displays no mass and no tenderness. Vulva exhibits no erythema, no exudate, no lesion, no rash and no tenderness. Vagina exhibits no lesion.  Musculoskeletal: Normal range of motion.  Neurological: She is oriented to person, place, and time.  Psychiatric: Memory, affect and judgment normal.  Vitals reviewed.   Results: Results for orders placed or performed in visit on 07/17/17 (from the past 24 hour(s))  POCT Wet Prep with KOH     Status: Abnormal   Collection Time: 07/17/17  5:04 PM  Result Value Ref Range   Trichomonas, UA Negative    Clue Cells Wet Prep HPF POC pos    Epithelial Wet Prep HPF POC  Few, Moderate, Many, Too numerous to count   Yeast Wet Prep HPF POC neg    Bacteria Wet Prep HPF POC  Few   RBC Wet Prep HPF POC     WBC Wet Prep HPF POC     KOH Prep POC Positive (A) Negative     Assessment/Plan: BV (bacterial vaginosis) - Recurrent. Pos wet prep.  Check MDL AV and BV. Rx cleocin. Line dry underwear/cont probiotics. Will f/u with results and sx. ? related to non-menstrual pain - Plan: clindamycin (CLEOCIN) 2 % vaginal cream, POCT Wet Prep with  KOH  Irregular menses - Menses Q2-3 months, which is normal for pt. Discussed BC options to regulate cycles but can also follow along. Pt to consider and f/u with pref.   Dysmenorrhea - Discussed BC options vs Rx NSAIDs. Also discussed endometriosis and  dx lap, tx with BC. May need to check u/s.   Encounter for other general counseling or advice on contraception    Meds ordered this encounter  Medications  . clindamycin (CLEOCIN) 2 % vaginal cream    Sig: Place 1 Applicatorful vaginally at bedtime.    Dispense:  40 g    Refill:  0    Order Specific Question:   Supervising Provider    Answer:   Nadara MustardHARRIS, ROBERT P [[id number]]      Return if symptoms worsen or fail to improve.  Alicia B. Copland, PA-C 07/17/2017 5:09 PM

## 2017-07-19 ENCOUNTER — Encounter: Payer: Self-pay | Admitting: Obstetrics and Gynecology

## 2017-07-24 ENCOUNTER — Encounter: Payer: Self-pay | Admitting: Obstetrics and Gynecology

## 2017-07-24 ENCOUNTER — Telehealth: Payer: Self-pay | Admitting: Obstetrics and Gynecology

## 2017-07-24 NOTE — Telephone Encounter (Signed)
Pt aware of 4 types of BV on One swab culture. Sx better with cleocin crm. F/u prn sx. If sx cont to recur, will do maintenance tx. F/u prn.

## 2017-08-22 ENCOUNTER — Ambulatory Visit (INDEPENDENT_AMBULATORY_CARE_PROVIDER_SITE_OTHER): Payer: 59 | Admitting: Family Medicine

## 2017-08-22 ENCOUNTER — Encounter: Payer: Self-pay | Admitting: Family Medicine

## 2017-08-22 VITALS — BP 136/72 | HR 94 | Temp 98.0°F | Ht 63.5 in | Wt 124.2 lb

## 2017-08-22 DIAGNOSIS — N760 Acute vaginitis: Secondary | ICD-10-CM | POA: Diagnosis not present

## 2017-08-22 DIAGNOSIS — G43009 Migraine without aura, not intractable, without status migrainosus: Secondary | ICD-10-CM | POA: Diagnosis not present

## 2017-08-22 DIAGNOSIS — Z0000 Encounter for general adult medical examination without abnormal findings: Secondary | ICD-10-CM

## 2017-08-22 DIAGNOSIS — B9689 Other specified bacterial agents as the cause of diseases classified elsewhere: Secondary | ICD-10-CM

## 2017-08-22 DIAGNOSIS — N926 Irregular menstruation, unspecified: Secondary | ICD-10-CM

## 2017-08-22 NOTE — Assessment & Plan Note (Signed)
This is her norm. LMP 08/10/2017.

## 2017-08-22 NOTE — Assessment & Plan Note (Signed)
No recurrence after latest treatment 07/2017. Will watch for now.

## 2017-08-22 NOTE — Assessment & Plan Note (Signed)
Preventative protocols reviewed and updated unless pt declined. Discussed healthy diet and lifestyle.  

## 2017-08-22 NOTE — Patient Instructions (Addendum)
For migraines - continue flexeril '5mg'$  - try to take at onset of headache. If losing effect, may take 2 or let us know for trial different migraine medicine. If headache becoming more frequent, let us know to consider daily migraine preventative medicine.   Health Maintenance, Female Adopting a healthy lifestyle and getting preventive care can go a long way to promote health and wellness. Talk with your health care provider about what schedule of regular examinations is right for you. This is a good chance for you to check in with your provider about disease prevention and staying healthy. In between checkups, there are plenty of things you can do on your own. Experts have done a lot of research about which lifestyle changes and preventive measures are most likely to keep you healthy. Ask your health care provider for more information. Weight and diet Eat a healthy diet  Be sure to include plenty of vegetables, fruits, low-fat dairy products, and lean protein.  Do not eat a lot of foods high in solid fats, added sugars, or salt.  Get regular exercise. This is one of the most important things you can do for your health. ? Most adults should exercise for at least 150 minutes each week. The exercise should increase your heart rate and make you sweat (moderate-intensity exercise). ? Most adults should also do strengthening exercises at least twice a week. This is in addition to the moderate-intensity exercise.  Maintain a healthy weight  Body mass index (BMI) is a measurement that can be used to identify possible weight problems. It estimates body fat based on height and weight. Your health care provider can help determine your BMI and help you achieve or maintain a healthy weight.  For females 79 years of age and older: ? A BMI below 18.5 is considered underweight. ? A BMI of 18.5 to 24.9 is normal. ? A BMI of 25 to 29.9 is considered overweight. ? A BMI of 30 and above is considered  obese.  Watch levels of cholesterol and blood lipids  You should start having your blood tested for lipids and cholesterol at 23 years of age, then have this test every 5 years.  You may need to have your cholesterol levels checked more often if: ? Your lipid or cholesterol levels are high. ? You are older than 23 years of age. ? You are at high risk for heart disease.  Cancer screening Lung Cancer  Lung cancer screening is recommended for adults 54-41 years old who are at high risk for lung cancer because of a history of smoking.  A yearly low-dose CT scan of the lungs is recommended for people who: ? Currently smoke. ? Have quit within the past 15 years. ? Have at least a 30-pack-year history of smoking. A pack year is smoking an average of one pack of cigarettes a day for 1 year.  Yearly screening should continue until it has been 15 years since you quit.  Yearly screening should stop if you develop a health problem that would prevent you from having lung cancer treatment.  Breast Cancer  Practice breast self-awareness. This means understanding how your breasts normally appear and feel.  It also means doing regular breast self-exams. Let your health care provider know about any changes, no matter how small.  If you are in your 20s or 30s, you should have a clinical breast exam (CBE) by a health care provider every 1-3 years as part of a regular health exam.  If  you are 40 or older, have a CBE every year. Also consider having a breast X-ray (mammogram) every year.  If you have a family history of breast cancer, talk to your health care provider about genetic screening.  If you are at high risk for breast cancer, talk to your health care provider about having an MRI and a mammogram every year.  Breast cancer gene (BRCA) assessment is recommended for women who have family members with BRCA-related cancers. BRCA-related cancers  include: ? Breast. ? Ovarian. ? Tubal. ? Peritoneal cancers.  Results of the assessment will determine the need for genetic counseling and BRCA1 and BRCA2 testing.  Cervical Cancer Your health care provider may recommend that you be screened regularly for cancer of the pelvic organs (ovaries, uterus, and vagina). This screening involves a pelvic examination, including checking for microscopic changes to the surface of your cervix (Pap test). You may be encouraged to have this screening done every 3 years, beginning at age 34.  For women ages 5-65, health care providers may recommend pelvic exams and Pap testing every 3 years, or they may recommend the Pap and pelvic exam, combined with testing for human papilloma virus (HPV), every 5 years. Some types of HPV increase your risk of cervical cancer. Testing for HPV may also be done on women of any age with unclear Pap test results.  Other health care providers may not recommend any screening for nonpregnant women who are considered low risk for pelvic cancer and who do not have symptoms. Ask your health care provider if a screening pelvic exam is right for you.  If you have had past treatment for cervical cancer or a condition that could lead to cancer, you need Pap tests and screening for cancer for at least 20 years after your treatment. If Pap tests have been discontinued, your risk factors (such as having a new sexual partner) need to be reassessed to determine if screening should resume. Some women have medical problems that increase the chance of getting cervical cancer. In these cases, your health care provider may recommend more frequent screening and Pap tests.  Colorectal Cancer  This type of cancer can be detected and often prevented.  Routine colorectal cancer screening usually begins at 23 years of age and continues through 23 years of age.  Your health care provider may recommend screening at an earlier age if you have risk factors  for colon cancer.  Your health care provider may also recommend using home test kits to check for hidden blood in the stool.  A small camera at the end of a tube can be used to examine your colon directly (sigmoidoscopy or colonoscopy). This is done to check for the earliest forms of colorectal cancer.  Routine screening usually begins at age 70.  Direct examination of the colon should be repeated every 5-10 years through 23 years of age. However, you may need to be screened more often if early forms of precancerous polyps or small growths are found.  Skin Cancer  Check your skin from head to toe regularly.  Tell your health care provider about any new moles or changes in moles, especially if there is a change in a mole's shape or color.  Also tell your health care provider if you have a mole that is larger than the size of a pencil eraser.  Always use sunscreen. Apply sunscreen liberally and repeatedly throughout the day.  Protect yourself by wearing long sleeves, pants, a wide-brimmed hat, and sunglasses  whenever you are outside.  Heart disease, diabetes, and high blood pressure  High blood pressure causes heart disease and increases the risk of stroke. High blood pressure is more likely to develop in: ? People who have blood pressure in the high end of the normal range (130-139/85-89 mm Hg). ? People who are overweight or obese. ? People who are African American.  If you are 64-28 years of age, have your blood pressure checked every 3-5 years. If you are 58 years of age or older, have your blood pressure checked every year. You should have your blood pressure measured twice-once when you are at a hospital or clinic, and once when you are not at a hospital or clinic. Record the average of the two measurements. To check your blood pressure when you are not at a hospital or clinic, you can use: ? An automated blood pressure machine at a pharmacy. ? A home blood pressure monitor.  If  you are between 46 years and 42 years old, ask your health care provider if you should take aspirin to prevent strokes.  Have regular diabetes screenings. This involves taking a blood sample to check your fasting blood sugar level. ? If you are at a normal weight and have a low risk for diabetes, have this test once every three years after 23 years of age. ? If you are overweight and have a high risk for diabetes, consider being tested at a younger age or more often. Preventing infection Hepatitis B  If you have a higher risk for hepatitis B, you should be screened for this virus. You are considered at high risk for hepatitis B if: ? You were born in a country where hepatitis B is common. Ask your health care provider which countries are considered high risk. ? Your parents were born in a high-risk country, and you have not been immunized against hepatitis B (hepatitis B vaccine). ? You have HIV or AIDS. ? You use needles to inject street drugs. ? You live with someone who has hepatitis B. ? You have had sex with someone who has hepatitis B. ? You get hemodialysis treatment. ? You take certain medicines for conditions, including cancer, organ transplantation, and autoimmune conditions.  Hepatitis C  Blood testing is recommended for: ? Everyone born from [birth year] through 1966. ? Anyone with known risk factors for hepatitis C.  Sexually transmitted infections (STIs)  You should be screened for sexually transmitted infections (STIs) including gonorrhea and chlamydia if: ? You are sexually active and are younger than 23 years of age. ? You are older than 23 years of age and your health care provider tells you that you are at risk for this type of infection. ? Your sexual activity has changed since you were last screened and you are at an increased risk for chlamydia or gonorrhea. Ask your health care provider if you are at risk.  If you do not have HIV, but are at risk, it may be recommended  that you take a prescription medicine daily to prevent HIV infection. This is called pre-exposure prophylaxis (PrEP). You are considered at risk if: ? You are sexually active and do not regularly use condoms or know the HIV status of your partner(s). ? You take drugs by injection. ? You are sexually active with a partner who has HIV.  Talk with your health care provider about whether you are at high risk of being infected with HIV. If you choose to begin PrEP, you should  first be tested for HIV. You should then be tested every 3 months for as long as you are taking PrEP. Pregnancy  If you are premenopausal and you may become pregnant, ask your health care provider about preconception counseling.  If you may become pregnant, take 400 to 800 micrograms (mcg) of folic acid every day.  If you want to prevent pregnancy, talk to your health care provider about birth control (contraception). Osteoporosis and menopause  Osteoporosis is a disease in which the bones lose minerals and strength with aging. This can result in serious bone fractures. Your risk for osteoporosis can be identified using a bone density scan.  If you are 38 years of age or older, or if you are at risk for osteoporosis and fractures, ask your health care provider if you should be screened.  Ask your health care provider whether you should take a calcium or vitamin D supplement to lower your risk for osteoporosis.  Menopause may have certain physical symptoms and risks.  Hormone replacement therapy may reduce some of these symptoms and risks. Talk to your health care provider about whether hormone replacement therapy is right for you. Follow these instructions at home:  Schedule regular health, dental, and eye exams.  Stay current with your immunizations.  Do not use any tobacco products including cigarettes, chewing tobacco, or electronic cigarettes.  If you are pregnant, do not drink alcohol.  If you are  breastfeeding, limit how much and how often you drink alcohol.  Limit alcohol intake to no more than 1 drink per day for nonpregnant women. One drink equals 12 ounces of beer, 5 ounces of wine, or 1 ounces of hard liquor.  Do not use street drugs.  Do not share needles.  Ask your health care provider for help if you need support or information about quitting drugs.  Tell your health care provider if you often feel depressed.  Tell your health care provider if you have ever been abused or do not feel safe at home. This information is not intended to replace advice given to you by your health care provider. Make sure you discuss any questions you have with your health care provider. Document Released: 11/15/2010 Document Revised: 10/08/2015 Document Reviewed: 02/03/2015 Elsevier Interactive Patient Education  [employer].

## 2017-08-22 NOTE — Assessment & Plan Note (Signed)
Returning migraines treated with flexeril PRN - encouraged keeping track of migraine triggers. She will let us know if they become more frequent or do not respond to flexeril.

## 2017-08-22 NOTE — Progress Notes (Addendum)
BP 136/72 (BP Location: Left Arm, Patient Position: Sitting, Cuff Size: Normal)   Pulse 94   Temp 98 F (36.7 C) (Oral)   Ht 5' 3.5" (1.613 m)   Wt 124 lb 4 oz (56.4 kg)   LMP 08/10/2017 (Exact Date)   SpO2 98%   BMI 21.66 kg/m    CC: CPE Subjective:    Patient ID: Shannon Mcdowell, female    DOB: [birth year], 22 y.o.   MRN: [id number]  HPI: Shannon GlasgowHannah L Keetch is a 22 y.o. female presenting on 08/22/2017 for Annual Exam   Recurrent BV seen by GYN - treated with cleocin cream with improvement.  Recurring migraines last one was last night. Increasing frequency - 2 per week. Minimizes caffeine. Sleeping well. Alcohol triggers migraines. + nausea, photo/phonophobia, occasionally with auras.   Preventative: Well woman exam - normal pap smear, neg HPV 08/2016 Completed HPV series  Tdap 2008, 2018  Not currently sexually active  LMP 08/10/2017 Seat belt use discussed Sunscreen use discussed. No changing moles on skin.  Non smoker Alcohol - rare  Caffeine: some tea  Lives with room mate and dog Has an older brother. 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 Interim medical history since our last visit reviewed. Allergies and medications reviewed and updated. Outpatient Medications Prior to Visit  Medication Sig Dispense Refill  . cyclobenzaprine (FLEXERIL) 5 MG tablet Take 0.5-1 tablets (2.5-5 mg total) by mouth 2 (two) times daily as needed (migraine headache - take at onset, sedation precautions). 30 tablet 1  . hydrOXYzine (ATARAX/VISTARIL) 10 MG tablet Take 0.5-1 tablets (5-10 mg total) by mouth 2 (two) times daily as needed for anxiety. 30 tablet 1  . Probiotic Product (PROBIOTIC DAILY PO) Take 1 tablet by mouth daily.    . clindamycin (CLEOCIN) 2 % vaginal cream Place 1  Applicatorful vaginally at bedtime. (Patient not taking: Reported on 08/22/2017) 40 g 0  . metroNIDAZOLE (METROGEL VAGINAL) 0.75 % vaginal gel Place 1 Applicatorful vaginally at bedtime. For 5 days then preventatively twice weekly for 3 months (Patient not taking: Reported on 08/22/2017) 70 g 4   No facility-administered medications prior to visit.      Per HPI unless specifically indicated in ROS section below Review of Systems  Constitutional: Negative for activity change, appetite change, chills, fatigue, fever and unexpected weight change.  HENT: Negative for hearing loss.   Eyes: Negative for visual disturbance.  Respiratory: Negative for cough, chest tightness, shortness of breath and wheezing.   Cardiovascular: Negative for chest pain, palpitations and leg swelling.  Gastrointestinal: Positive for nausea and vomiting (once). Negative for abdominal distention, abdominal pain, blood in stool, constipation and diarrhea.  Genitourinary: Negative for difficulty urinating and hematuria.  Musculoskeletal: Negative for arthralgias, myalgias and neck pain.  Skin: Negative for rash.  Neurological: Positive for headaches (migraines). Negative for dizziness, seizures and syncope.  Hematological: Negative for adenopathy. Does not bruise/bleed easily.  Psychiatric/Behavioral: Negative for dysphoric mood. The patient is not nervous/anxious.        Objective:    BP 136/72 (BP Location: Left Arm, Patient Position: Sitting, Cuff Size: Normal)   Pulse 94   Temp 98 F (36.7 C) (Oral)   Ht 5' 3.5" (1.613 m)   Wt 124 lb 4 oz (56.4 kg)   LMP 08/10/2017 (Exact Date)  SpO2 98%   BMI 21.66 kg/m   Wt Readings from Last 3 Encounters:  08/22/17 124 lb 4 oz (56.4 kg)  07/17/17 123 lb (55.8 kg)  04/21/17 121 lb (54.9 kg)    Physical Exam  Constitutional: She is oriented to person, place, and time. She appears well-developed and well-nourished. No distress.  HENT:  Head: Normocephalic and atraumatic.    Right Ear: Hearing, tympanic membrane, external ear and ear canal normal.  Left Ear: Hearing, tympanic membrane, external ear and ear canal normal.  Nose: Nose normal.  Mouth/Throat: Uvula is midline, oropharynx is clear and moist and mucous membranes are normal. No oropharyngeal exudate, posterior oropharyngeal edema or posterior oropharyngeal erythema.  Eyes: Pupils are equal, round, and reactive to light. Conjunctivae and EOM are normal. No scleral icterus.  Neck: Normal range of motion. Neck supple. No thyromegaly present.  Cardiovascular: Normal rate, regular rhythm, normal heart sounds and intact distal pulses.  No murmur heard. Pulses:      Radial pulses are 2+ on the right side, and 2+ on the left side.  Pulmonary/Chest: Effort normal and breath sounds normal. No respiratory distress. She has no wheezes. She has no rales.  Abdominal: Soft. Bowel sounds are normal. She exhibits no distension and no mass. There is no tenderness. There is no rebound and no guarding.  Musculoskeletal: Normal range of motion. She exhibits no edema.  Lymphadenopathy:    She has no cervical adenopathy.  Neurological: She is alert and oriented to person, place, and time.  CN grossly intact, station and gait intact  Skin: Skin is warm and dry. No rash noted.  Psychiatric: She has a normal mood and affect. Her behavior is normal. Judgment and thought content normal.  Nursing note and vitals reviewed.  Results for orders placed or performed in visit on 07/17/17  POCT Wet Prep with KOH  Result Value Ref Range   Trichomonas, UA Negative    Clue Cells Wet Prep HPF POC pos    Epithelial Wet Prep HPF POC  Few, Moderate, Many, Too numerous to count   Yeast Wet Prep HPF POC neg    Bacteria Wet Prep HPF POC  Few   RBC Wet Prep HPF POC     WBC Wet Prep HPF POC     KOH Prep POC Positive (A) Negative      Assessment & Plan:   Problem List Items Addressed This Visit    Bacterial vaginosis    No recurrence  after latest treatment 07/2017. Will watch for now.       Health maintenance examination - Primary    Preventative protocols reviewed and updated unless pt declined. Discussed healthy diet and lifestyle.       Irregular periods/menstrual cycles    This is her norm. LMP 08/10/2017.       Migraine headache    Returning migraines treated with flexeril PRN - encouraged keeping track of migraine triggers. She will let us know if they become more frequent or do not respond to flexeril.           No orders of the defined types were placed in this encounter.  No orders of the defined types were placed in this encounter.   Follow up plan: Return if symptoms worsen or fail to improve.  Eustaquio Boyden, MD

## 2017-09-12 ENCOUNTER — Encounter: Payer: Self-pay | Admitting: Family Medicine

## 2017-09-13 MED ORDER — CYCLOBENZAPRINE HCL 5 MG PO TABS: 2.5000 mg | ORAL_TABLET | Freq: Two times a day (BID) | ORAL | 1 refills | Status: DC | PRN

## 2017-11-29 ENCOUNTER — Encounter: Payer: Self-pay | Admitting: Family Medicine

## 2018-03-29 ENCOUNTER — Encounter: Payer: Self-pay | Admitting: Family Medicine

## 2018-03-30 ENCOUNTER — Other Ambulatory Visit: Payer: Self-pay

## 2018-03-30 NOTE — Telephone Encounter (Signed)
See TE, 03/29/18 for rx request.

## 2018-03-30 NOTE — Telephone Encounter (Signed)
Received MyChart message from pt requesting refill for metronidazole gel. [See Pt Msg, 03/29/18.]  Last rx:  07/06/17, #70g/4 Last OV:  08/22/17, CPE Next OV:  08/28/18, CPE

## 2018-04-02 MED ORDER — METRONIDAZOLE 0.75 % VA GEL: 1.0000 | Freq: Every day | VAGINAL | 1 refills | Status: DC

## 2018-06-01 ENCOUNTER — Encounter: Payer: Self-pay | Admitting: Family Medicine

## 2018-06-01 ENCOUNTER — Ambulatory Visit: Payer: 59 | Admitting: Family Medicine

## 2018-06-01 VITALS — BP 112/64 | HR 121 | Temp 97.9°F | Ht 63.5 in | Wt 127.0 lb

## 2018-06-01 DIAGNOSIS — R05 Cough: Secondary | ICD-10-CM | POA: Diagnosis not present

## 2018-06-01 DIAGNOSIS — J101 Influenza due to other identified influenza virus with other respiratory manifestations: Secondary | ICD-10-CM | POA: Diagnosis not present

## 2018-06-01 DIAGNOSIS — R059 Cough, unspecified: Secondary | ICD-10-CM

## 2018-06-01 LAB — POC INFLUENZA A&B (BINAX/QUICKVUE)
INFLUENZA B, POC: POSITIVE — AB
Influenza A, POC: NEGATIVE

## 2018-06-01 MED ORDER — BENZONATATE 100 MG PO CAPS: 100.0000 mg | ORAL_CAPSULE | Freq: Three times a day (TID) | ORAL | 0 refills | Status: DC | PRN

## 2018-06-01 NOTE — Assessment & Plan Note (Addendum)
Recently treated with zpack for bronchitis along with tessalon perls. Has flu like illness - check swab.

## 2018-06-01 NOTE — Progress Notes (Signed)
BP 112/64 (BP Location: Left Arm, Patient Position: Sitting, Cuff Size: Normal)   Pulse (!) 121   Temp 97.9 F (36.6 C) (Oral)   Ht 5' 3.5" (1.613 m)   Wt 127 lb (57.6 kg)   LMP 05/10/2018   SpO2 95%   BMI 22.14 kg/m    CC: cough Subjective:    Patient ID: Shannon Mcdowell, female    DOB: [birth year], 23 y.o.   MRN: [id number]  HPI: Shannon Mcdowell is a 23 y.o. female presenting on 06/01/2018 for Cough (C/o productive cough-with green mucous, chest congestion and runny nose. Sxs started 2 days ago. Pt was dx and tx with abx 2 wks ago with bronchitis and had improved. )   [facility] with dx bronchitis treated with zpack and tessalon perls with benefit. Tested negative for flu.   3d ago started coughing again. Productive cough of green/yellow mucous. Chills. Tmax 99.3. Increased sinus drainage, swollen glands, PNdrainage, ST, hoarse voice. Frontal sinus pressure headache. Chest > head congestion. Rhinorrhea. Body aches on Wednesday - after she started gym.   Has been treating with mucinex fast relief, tessalon perls.   No ear or tooth pain, dyspnea or wheezing.  No smokers at home. No h/o asthma.      Relevant past medical, surgical, family and social history reviewed and updated as indicated. Interim medical history since our last visit reviewed. Allergies and medications reviewed and updated. Outpatient Medications Prior to Visit  Medication Sig Dispense Refill  . cyclobenzaprine (FLEXERIL) 5 MG tablet Take 0.5-1 tablets (2.5-5 mg total) by mouth 2 (two) times daily as needed (migraine headache - take at onset, sedation precautions). 30 tablet 1  . hydrOXYzine (ATARAX/VISTARIL) 10 MG tablet Take 0.5-1 tablets (5-10 mg total) by mouth 2 (two) times daily as needed for anxiety. 30 tablet 1  . metroNIDAZOLE (METROGEL VAGINAL) 0.75 % vaginal gel Place 1 Applicatorful vaginally at bedtime. For 5 days then preventatively twice weekly for 3 months 70 g 1  .  Probiotic Product (PROBIOTIC DAILY PO) Take 1 tablet by mouth daily.    . clindamycin (CLEOCIN) 2 % vaginal cream Place 1 Applicatorful vaginally at bedtime. (Patient not taking: Reported on 08/22/2017) 40 g 0   No facility-administered medications prior to visit.      Per HPI unless specifically indicated in ROS section below Review of Systems Objective:    BP 112/64 (BP Location: Left Arm, Patient Position: Sitting, Cuff Size: Normal)   Pulse (!) 121   Temp 97.9 F (36.6 C) (Oral)   Ht 5' 3.5" (1.613 m)   Wt 127 lb (57.6 kg)   LMP 05/10/2018   SpO2 95%   BMI 22.14 kg/m   Wt Readings from Last 3 Encounters:  06/01/18 127 lb (57.6 kg)  08/22/17 124 lb 4 oz (56.4 kg)  07/17/17 123 lb (55.8 kg)    Physical Exam Vitals signs and nursing note reviewed.  Constitutional:      General: She is not in acute distress.    Appearance: Normal appearance. She is well-developed.  HENT:     Head: Normocephalic and atraumatic.     Right Ear: Hearing, tympanic membrane, ear canal and external ear normal.     Left Ear: Hearing, tympanic membrane, ear canal and external ear normal.     Nose: Congestion and rhinorrhea (L>R) present. No mucosal edema.     Right Sinus: No maxillary sinus tenderness or frontal sinus tenderness.  Left Sinus: No maxillary sinus tenderness or frontal sinus tenderness.     Mouth/Throat:     Mouth: Mucous membranes are moist. Mucous membranes are pale.     Pharynx: Oropharynx is clear. Uvula midline. No oropharyngeal exudate or posterior oropharyngeal erythema.     Tonsils: No tonsillar abscesses.  Eyes:     General: No scleral icterus.    Conjunctiva/sclera: Conjunctivae normal.     Pupils: Pupils are equal, round, and reactive to light.  Neck:     Musculoskeletal: Normal range of motion and neck supple.  Cardiovascular:     Rate and Rhythm: Normal rate and regular rhythm.     Pulses: Normal pulses.     Heart sounds: Normal heart sounds. No murmur.    Pulmonary:     Effort: Pulmonary effort is normal. No respiratory distress.     Breath sounds: Normal breath sounds. No wheezing, rhonchi or rales.     Comments: Lungs clear, harsh cough present Lymphadenopathy:     Cervical: No cervical adenopathy.  Skin:    General: Skin is warm and dry.     Findings: No rash.  Neurological:     Mental Status: She is alert.       Results for orders placed or performed in visit on 06/01/18  POC Influenza A&B(BINAX/QUICKVUE)  Result Value Ref Range   Influenza A, POC Negative Negative   Influenza B, POC Positive (A) Negative   Assessment & Plan:   Problem List Items Addressed This Visit    Influenza B - Primary    Flu swab quickly strongly positive  Outside of window for tamiflu - and didn't tolerate this last year. Overall improving so will hold tamiflu this year.  Supportive care reviewed.  Update if not improving with treatment.       Cough    Recently treated with zpack for bronchitis along with tessalon perls. Has flu like illness - check swab.        Relevant Orders   POC Influenza A&B(BINAX/QUICKVUE) (Completed)       Meds ordered this encounter  Medications  . benzonatate (TESSALON) 100 MG capsule    Sig: Take 1-2 capsules (100-200 mg total) by mouth 3 (three) times daily as needed for cough.    Dispense:  20 capsule    Refill:  0   Orders Placed This Encounter  Procedures  . POC Influenza A&B(BINAX/QUICKVUE)    Follow up plan: Return if symptoms worsen or fail to improve.  Eustaquio BoydenJavier Mohannad Olivero, MD

## 2018-06-01 NOTE — Patient Instructions (Signed)
You have influenza B - supportive care - fluids, rest, ibuprofen as needed 400 mg with meals, tessalon perls for cough, mucinex to bring out mucous.  Let us know if not improving with treatment.  Influenza, Adult Influenza, more commonly known as "the flu," is a viral infection that mainly affects the respiratory tract. The respiratory tract includes organs that help you breathe, such as the lungs, nose, and throat. The flu causes many symptoms similar to the common cold along with high fever and body aches. The flu spreads easily from person to person (is contagious). Getting a flu shot (influenza vaccination) every year is the best way to prevent the flu. What are the causes? This condition is caused by the influenza virus. You can get the virus by:  Breathing in droplets that are in the air from an infected person's cough or sneeze.  Touching something that has been exposed to the virus (has been contaminated) and then touching your mouth, nose, or eyes. What increases the risk? The following factors may make you more likely to get the flu:  Not washing or sanitizing your hands often.  Having close contact with many people during cold and flu season.  Touching your mouth, eyes, or nose without first washing or sanitizing your hands.  Not getting a yearly (annual) flu shot. You may have a higher risk for the flu, including serious problems such as a lung infection (pneumonia), if you:  Are older than 65.  Are pregnant.  Have a weakened disease-fighting system (immune system). You may have a weakened immune system if you: ? Have HIV or AIDS. ? Are undergoing chemotherapy. ? Are taking medicines that reduce (suppress) the activity of your immune system.  Have a long-term (chronic) illness, such as heart disease, kidney disease, diabetes, or lung disease.  Have a liver disorder.  Are severely overweight (morbidly obese).  Have anemia. This is a condition that affects your red  blood cells.  Have asthma. What are the signs or symptoms? Symptoms of this condition usually begin suddenly and last 4-14 days. They may include:  Fever and chills.  Headaches, body aches, or muscle aches.  Sore throat.  Cough.  Runny or stuffy (congested) nose.  Chest discomfort.  Poor appetite.  Weakness or fatigue.  Dizziness.  Nausea or vomiting. How is this diagnosed? This condition may be diagnosed based on:  Your symptoms and medical history.  A physical exam.  Swabbing your nose or throat and testing the fluid for the influenza virus. How is this treated? If the flu is diagnosed early, you can be treated with medicine that can help reduce how severe the illness is and how long it lasts (antiviral medicine). This may be given by mouth (orally) or through an IV. Taking care of yourself at home can help relieve symptoms. Your health care provider may recommend:  Taking over-the-counter medicines.  Drinking plenty of fluids. In many cases, the flu goes away on its own. If you have severe symptoms or complications, you may be treated in a hospital. Follow these instructions at home: Activity  Rest as needed and get plenty of sleep.  Stay home from work or school as told by your health care provider. Unless you are visiting your health care provider, avoid leaving home until your fever has been gone for 24 hours without taking medicine. Eating and drinking  Take an oral rehydration solution (ORS). This is a drink that is sold at pharmacies and retail stores.  Drink enough  fluid to keep your urine pale yellow.  Drink clear fluids in small amounts as you are able. Clear fluids include water, ice chips, diluted fruit juice, and low-calorie sports drinks.  Eat bland, easy-to-digest foods in small amounts as you are able. These foods include bananas, applesauce, rice, lean meats, toast, and crackers.  Avoid drinking fluids that contain a lot of sugar or  caffeine, such as energy drinks, regular sports drinks, and soda.  Avoid alcohol.  Avoid spicy or fatty foods. General instructions      Take over-the-counter and prescription medicines only as told by your health care provider.  Use a cool mist humidifier to add humidity to the air in your home. This can make it easier to breathe.  Cover your mouth and nose when you cough or sneeze.  Wash your hands with soap and water often, especially after you cough or sneeze. If soap and water are not available, use alcohol-based hand sanitizer.  Keep all follow-up visits as told by your health care provider. This is important. How is this prevented?   Get an annual flu shot. You may get the flu shot in late summer, fall, or winter. Ask your health care provider when you should get your flu shot.  Avoid contact with people who are sick during cold and flu season. This is generally fall and winter. Contact a health care provider if:  You develop new symptoms.  You have: ? Chest pain. ? Diarrhea. ? A fever.  Your cough gets worse.  You produce more mucus.  You feel nauseous or you vomit. Get help right away if:  You develop shortness of breath or difficulty breathing.  Your skin or nails turn a bluish color.  You have severe pain or stiffness in your neck.  You develop a sudden headache or sudden pain in your face or ear.  You cannot eat or drink without vomiting. Summary  Influenza, more commonly known as "the flu," is a viral infection that primarily affects your respiratory tract.  Symptoms of the flu usually begin suddenly and last 4-14 days.  Getting an annual flu shot is the best way to prevent getting the flu.  Stay home from work or school as told by your health care provider. Unless you are visiting your health care provider, avoid leaving home until your fever has been gone for 24 hours without taking medicine.  Keep all follow-up visits as told by your health  care provider. This is important. This information is not intended to replace advice given to you by your health care provider. Make sure you discuss any questions you have with your health care provider. Document Released: 04/29/2000 Document Revised: 10/18/2017 Document Reviewed: 10/18/2017 Elsevier Interactive Patient Education  2019 [employer].

## 2018-06-01 NOTE — Assessment & Plan Note (Signed)
Flu swab quickly strongly positive  Outside of window for tamiflu - and didn't tolerate this last year. Overall improving so will hold tamiflu this year.  Supportive care reviewed.  Update if not improving with treatment.

## 2018-07-04 ENCOUNTER — Encounter: Payer: Self-pay | Admitting: Family Medicine

## 2018-07-06 MED ORDER — METRONIDAZOLE 0.75 % VA GEL: 1.0000 | Freq: Every day | VAGINAL | 1 refills | Status: DC

## 2018-08-22 ENCOUNTER — Other Ambulatory Visit: Payer: 59

## 2018-08-28 ENCOUNTER — Encounter: Payer: 59 | Admitting: Family Medicine

## 2018-11-19 ENCOUNTER — Other Ambulatory Visit: Payer: Self-pay | Admitting: Family Medicine

## 2018-11-19 ENCOUNTER — Other Ambulatory Visit: Payer: 59

## 2018-11-23 ENCOUNTER — Encounter: Payer: Self-pay | Admitting: Family Medicine

## 2018-11-23 ENCOUNTER — Telehealth (INDEPENDENT_AMBULATORY_CARE_PROVIDER_SITE_OTHER): Payer: 59 | Admitting: Family Medicine

## 2018-11-23 VITALS — Ht 63.5 in

## 2018-11-23 DIAGNOSIS — Z0000 Encounter for general adult medical examination without abnormal findings: Secondary | ICD-10-CM

## 2018-11-23 DIAGNOSIS — G43009 Migraine without aura, not intractable, without status migrainosus: Secondary | ICD-10-CM | POA: Diagnosis not present

## 2018-11-23 DIAGNOSIS — N760 Acute vaginitis: Secondary | ICD-10-CM | POA: Diagnosis not present

## 2018-11-23 DIAGNOSIS — F4323 Adjustment disorder with mixed anxiety and depressed mood: Secondary | ICD-10-CM

## 2018-11-23 DIAGNOSIS — B9689 Other specified bacterial agents as the cause of diseases classified elsewhere: Secondary | ICD-10-CM

## 2018-11-23 MED ORDER — CYCLOBENZAPRINE HCL 5 MG PO TABS: 2.5000 mg | ORAL_TABLET | Freq: Two times a day (BID) | ORAL | 1 refills | Status: DC | PRN

## 2018-11-23 MED ORDER — HYDROXYZINE HCL 10 MG PO TABS: 5.0000 mg | ORAL_TABLET | Freq: Two times a day (BID) | ORAL | 1 refills | Status: DC | PRN

## 2018-11-23 MED ORDER — METRONIDAZOLE 0.75 % VA GEL: 1.0000 | Freq: Every day | VAGINAL | 1 refills | Status: DC

## 2018-11-23 NOTE — Assessment & Plan Note (Signed)
Recurrent BV.  Metrogel twice weekly refilled.

## 2018-11-23 NOTE — Assessment & Plan Note (Signed)
Stable period with flexeril abortively - refilled.

## 2018-11-23 NOTE — Progress Notes (Signed)
Virtual visit completed through MyChart. Due to national recommendations of social distancing due to COVID-19, a virtual visit is felt to be most appropriate for this patient at this time. Reviewed limitations of a virtual visit.   Patient location: work, alone  [facility] If any vitals were documented, they were collected by patient at home unless specified below.    Ht 5' 3.5" (1.613 m)   LMP 10/22/2018   BMI 22.14 kg/m   BP Readings from Last 3 Encounters:  06/01/18 112/64  08/22/17 136/72  07/17/17 120/80    CC: CPE Subjective:    Patient ID: Shannon Mcdowell, female    DOB: 06/26/1995, 23 y.o.   MRN: [id number]  HPI: Shannon GlasgowHannah L Linney is a 23 y.o. 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   Recurrent BV seen by GYN - treated with cleocin cream with improvement.  Stable migraines   Preventative: Well woman exam - normal pap smear, neg HPV 08/2016 Completed HPV series  Tdap 2008, 2018  Not currently sexually active  LMP 10/22/2018, regular monthly Seat belt use discussed Sunscreen use discussed. No changing moles on skin.  Non smoker. Roommate smokes outdoors.  Alcohol - rare  Dentist q6 mo  Eye exam yearly   Caffeine:some tea Lives withroom mate and dog Has an older brother. 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 Interim medical history since our last visit reviewed. Allergies and medications reviewed and updated. Outpatient Medications Prior to Visit  Medication Sig Dispense Refill  . cyclobenzaprine (FLEXERIL) 5 MG tablet Take 0.5-1 tablets (2.5-5 mg total) by mouth 2 (two) times daily as needed (migraine headache -  take at onset, sedation precautions). 30 tablet 1  . hydrOXYzine (ATARAX/VISTARIL) 10 MG tablet Take 0.5-1 tablets (5-10 mg total) by mouth 2 (two) times daily as needed for anxiety. 30 tablet 1  . metroNIDAZOLE (METROGEL VAGINAL) 0.75 % vaginal gel Place 1 Applicatorful vaginally at bedtime. For 5 days then preventatively twice weekly for 3 months 70 g 1  . benzonatate (TESSALON) 100 MG capsule Take 1-2 capsules (100-200 mg total) by mouth 3 (three) times daily as needed for cough. 20 capsule 0  . Probiotic Product (PROBIOTIC DAILY PO) Take 1 tablet by mouth daily.     No facility-administered medications prior to visit.      Per HPI unless specifically indicated in ROS section below Review of Systems  Constitutional: Negative for activity change, appetite change, chills, fatigue, fever and unexpected weight change.  HENT: Negative for hearing loss.   Eyes: Negative for visual disturbance.  Respiratory: Negative for cough, chest tightness, shortness of breath and wheezing.   Cardiovascular: Negative for chest pain, palpitations and leg swelling.  Gastrointestinal: Negative for abdominal distention, abdominal pain, blood in stool, constipation, diarrhea, nausea and vomiting.  Genitourinary: Negative for difficulty urinating and hematuria.  Musculoskeletal: Negative for arthralgias, myalgias and neck pain.  Skin: Negative for rash.  Neurological: Positive for headaches (intermittent, treated with NSAIDs). Negative for dizziness, seizures and syncope.       2 migraines a month  Hematological: Negative for adenopathy. Does not bruise/bleed easily.  Psychiatric/Behavioral: Negative for dysphoric mood. The patient is not nervous/anxious.  Objective:    Ht 5' 3.5" (1.613 m)   LMP 10/22/2018   BMI 22.14 kg/m   Wt Readings from Last 3 Encounters:  06/01/18 127 lb (57.6 kg)  08/22/17 124 lb 4 oz (56.4 kg)  07/17/17 123 lb (55.8 kg)     Physical exam: Gen: alert, NAD, not ill appearing  Pulm: speaks in complete sentences without increased work of breathing Psych: normal mood, normal thought content      Lab Results  Component Value Date   CHOL 160 08/17/2016   HDL 62.80 08/17/2016   LDLCALC 71 08/17/2016   TRIG 128.0 08/17/2016   CHOLHDL 3 08/17/2016    Lab Results  Component Value Date   CREATININE 0.70 08/12/2015   BUN 10 08/12/2015   NA 139 08/12/2015   K 4.0 08/12/2015   CL 103 08/12/2015   CO2 26 08/12/2015   Lab Results  Component Value Date   WBC 5.8 08/17/2016   HGB 13.4 08/17/2016   HCT 38.7 08/17/2016   MCV 96.9 08/17/2016   PLT 209.0 08/17/2016   Assessment & Plan:   Problem List Items Addressed This Visit    Migraine headache    Stable period with flexeril abortively - refilled.       Relevant Medications   cyclobenzaprine (FLEXERIL) 5 MG tablet   Health maintenance examination - Primary    Preventative protocols reviewed and updated unless pt declined. Discussed healthy diet and lifestyle.       Bacterial vaginosis    Recurrent BV.  Metrogel twice weekly refilled.       Adjustment disorder with mixed anxiety and depressed mood    PRN hydroxyzine refilled.          Meds ordered this encounter  Medications  . cyclobenzaprine (FLEXERIL) 5 MG tablet    Sig: Take 0.5-1 tablets (2.5-5 mg total) by mouth 2 (two) times daily as needed (migraine headache - take at onset, sedation precautions).    Dispense:  30 tablet    Refill:  1  . hydrOXYzine (ATARAX/VISTARIL) 10 MG tablet    Sig: Take 0.5-1 tablets (5-10 mg total) by mouth 2 (two) times daily as needed for anxiety.    Dispense:  30 tablet    Refill:  1  . metroNIDAZOLE (METROGEL VAGINAL) 0.75 % vaginal gel    Sig: Place 1 Applicatorful vaginally at bedtime. For 5 days then preventatively twice weekly for 3 months    Dispense:  70 g    Refill:  1   No orders of the defined types were placed in this encounter.   I discussed the assessment and treatment plan with the  patient. The patient was provided an opportunity to ask questions and all were answered. The patient agreed with the plan and demonstrated an understanding of the instructions. The patient was advised to call back or seek an in-person evaluation if the symptoms worsen or if the condition fails to improve as anticipated.  Follow up plan: Return in about 1 year (around 11/23/2019) for annual exam, prior fasting for blood work.  Ria Bush, MD

## 2018-11-23 NOTE — Assessment & Plan Note (Signed)
Preventative protocols reviewed and updated unless pt declined. Discussed healthy diet and lifestyle.  

## 2018-11-23 NOTE — Assessment & Plan Note (Signed)
PRN hydroxyzine refilled.

## 2019-08-14 ENCOUNTER — Telehealth: Payer: Self-pay | Admitting: Family Medicine

## 2019-08-14 NOTE — Telephone Encounter (Signed)
Patient called about notification she received on my chart  She is due on 4/6 for her PAP. She stated she also received a notification about a vaccine she is due for. Patient is not sure what vaccine this is.  Can you verify what is due for?

## 2019-08-14 NOTE — Telephone Encounter (Signed)
Spoke with pt informing her she is due for a PAP smear this year.  But since she her CPE is 11/25/19, we can do it then.  Also, reassured pt her tetanus is up to date.  Pt verbalizes understanding.

## 2019-08-21 ENCOUNTER — Encounter: Payer: Self-pay | Admitting: Family Medicine

## 2019-08-21 NOTE — Telephone Encounter (Signed)
Spoke with pt to schedule OV.  She requested video visit and scheduled tomorrow at 10:40 with Dr. Selena Batten.  C/o sinus congestion, pressure and drainage.

## 2019-08-22 ENCOUNTER — Telehealth (INDEPENDENT_AMBULATORY_CARE_PROVIDER_SITE_OTHER): Payer: 59 | Admitting: Family Medicine

## 2019-08-22 ENCOUNTER — Encounter: Payer: Self-pay | Admitting: Family Medicine

## 2019-08-22 VITALS — Wt 136.0 lb

## 2019-08-22 DIAGNOSIS — J0111 Acute recurrent frontal sinusitis: Secondary | ICD-10-CM | POA: Diagnosis not present

## 2019-08-22 MED ORDER — DOXYCYCLINE HYCLATE 100 MG PO TABS: 100.0000 mg | ORAL_TABLET | Freq: Two times a day (BID) | ORAL | 0 refills | Status: AC

## 2019-08-22 NOTE — Progress Notes (Signed)
I connected with Shannon Mcdowell on 08/22/19 at 10:40 AM EDT by video and verified that I am speaking with the correct person using two identifiers.   I discussed the limitations, risks, security and privacy concerns of performing an evaluation and management service by video and the availability of in person appointments. I also discussed with the patient that there may be a patient responsible charge related to this service. The patient expressed understanding and agreed to proceed.  Patient location: Home Provider Location: [facility] Participants: Lesleigh Noe and Shannon Mcdowell   Subjective:     Shannon Mcdowell is a 24 y.o. female presenting for Cough (nasal congestion, post nasal drip. Tried Claritin, Nyquil, Dayquil. Went to Urgent Care last week and was DX with URI and took Amoxicillin. Started to feel bad again 2 days after completing antibiotic)     Cough Episode onset: March 22. Associated symptoms include chills, nasal congestion and a sore throat. Pertinent negatives include no fever.   Initially thought it was food poisoning - diarrhea and vomiting - Friend and roommate got it and told it was stomach bug. Then overtime could not breath - snotty and drainage. Thought it was strept throat - went to urgent care - diagnosed with URI and treated with amoxicillin BID x 7 days. Completed the course of abx and noted improvement. Then 2 days after she finished treatment if felt like her symptoms returned. Has been taking claritin. Has tried dayquil/nyquil, mucinex. Sinus congestion and a lot of drainage down the throat. Has tried nasal spray but has a hard time tolerating this.   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 Did not get tested for Covid.   Review of Systems  Constitutional: Positive for chills. Negative for fever.  HENT: Positive for congestion and sore throat. Negative for sinus pressure and sinus pain.   Respiratory: Positive for  cough.      Social History   Tobacco Use  Smoking Status Former Smoker  Smokeless Tobacco Never Used        Objective:   BP Readings from Last 3 Encounters:  06/01/18 112/64  08/22/17 136/72  07/17/17 120/80   Wt Readings from Last 3 Encounters:  08/22/19 136 lb (61.7 kg)  06/01/18 127 lb (57.6 kg)  08/22/17 124 lb 4 oz (56.4 kg)    Wt 136 lb (61.7 kg) Comment: per patient  LMP 08/09/2019   BMI 23.71 kg/m    Physical Exam Constitutional:      Appearance: Normal appearance. She is not ill-appearing.  HENT:     Head: Normocephalic and atraumatic.     Right Ear: External ear normal.     Left Ear: External ear normal.     Nose:     Comments: congestion Eyes:     Conjunctiva/sclera: Conjunctivae normal.  Pulmonary:     Effort: Pulmonary effort is normal. No respiratory distress.     Comments: Occasional cough Neurological:     Mental Status: She is alert. Mental status is at baseline.  Psychiatric:        Mood and Affect: Mood normal.        Behavior: Behavior normal.        Thought Content: Thought content normal.        Judgment: Judgment normal.            Assessment & Plan:   Problem List Items Addressed This Visit    None  Visit Diagnoses    Acute recurrent frontal sinusitis    -  Primary   Relevant Medications   doxycycline (VIBRA-TABS) 100 MG tablet     Discussed that I suspect she may have had Covid but she and her friends were not tested. At this point with symptom onset >2 weeks ago and no longer fevers do not feel testing is indicated and suspect she may have superimposed sinus infection.   Course of Abx. Also discussed switching allergy medication as it could be allergic and if symptoms do not resolve with second course of abx this is more likely the cause and would recommend returning to clinic.   She cannot tolerate nasal sprays or saline rinses   Return if symptoms worsen or fail to improve.  Shannon Child, MD

## 2019-10-02 ENCOUNTER — Encounter: Payer: Self-pay | Admitting: Family Medicine

## 2019-10-03 NOTE — Telephone Encounter (Signed)
Spoke with Shannon Mcdowell to schedule virtual, however, we have no appts available.  Shannon Mcdowell states she was already heading to UC.  Says she will send Dr. Reece Agar a MyChart message with an update on her visit with them.  FYI to Dr. Reece Agar.

## 2019-11-18 ENCOUNTER — Other Ambulatory Visit: Payer: Self-pay | Admitting: Family Medicine

## 2019-11-18 DIAGNOSIS — N926 Irregular menstruation, unspecified: Secondary | ICD-10-CM

## 2019-11-19 ENCOUNTER — Other Ambulatory Visit: Payer: Self-pay

## 2019-11-19 ENCOUNTER — Other Ambulatory Visit (INDEPENDENT_AMBULATORY_CARE_PROVIDER_SITE_OTHER): Payer: 59

## 2019-11-19 DIAGNOSIS — N926 Irregular menstruation, unspecified: Secondary | ICD-10-CM | POA: Diagnosis not present

## 2019-11-19 DIAGNOSIS — Z113 Encounter for screening for infections with a predominantly sexual mode of transmission: Secondary | ICD-10-CM

## 2019-11-20 LAB — BASIC METABOLIC PANEL
BUN: 8 mg/dL (ref 6–23)
CO2: 25 mEq/L (ref 19–32)
Calcium: 9.4 mg/dL (ref 8.4–10.5)
Chloride: 105 mEq/L (ref 96–112)
Creatinine, Ser: 0.68 mg/dL (ref 0.40–1.20)
GFR: 105.34 mL/min (ref >60.00)
Glucose, Bld: 94 mg/dL (ref 70–99)
Potassium: 4.6 mEq/L (ref 3.5–5.1)
Sodium: 140 mEq/L (ref 135–145)

## 2019-11-20 LAB — HIV ANTIBODY (ROUTINE TESTING W REFLEX): HIV 1&2 Ab, 4th Generation: NONREACTIVE

## 2019-11-20 LAB — RPR: RPR Ser Ql: NONREACTIVE

## 2019-11-20 LAB — C. TRACHOMATIS/N. GONORRHOEAE RNA
C. trachomatis RNA, TMA: NOT DETECTED
N. gonorrhoeae RNA, TMA: NOT DETECTED

## 2019-11-25 ENCOUNTER — Other Ambulatory Visit (HOSPITAL_COMMUNITY)
Admission: RE | Admit: 2019-11-25 | Discharge: 2019-11-25 | Disposition: A | Discharge status: Home / Self Care | Payer: 59 | Source: Ambulatory Visit | Attending: Family Medicine | Admitting: Family Medicine

## 2019-11-25 ENCOUNTER — Encounter: Payer: Self-pay | Admitting: Family Medicine

## 2019-11-25 ENCOUNTER — Other Ambulatory Visit: Payer: Self-pay

## 2019-11-25 ENCOUNTER — Ambulatory Visit (INDEPENDENT_AMBULATORY_CARE_PROVIDER_SITE_OTHER): Payer: 59 | Admitting: Family Medicine

## 2019-11-25 VITALS — BP 118/64 | HR 91 | Temp 97.7°F | Ht 63.5 in | Wt 144.6 lb

## 2019-11-25 DIAGNOSIS — Z0000 Encounter for general adult medical examination without abnormal findings: Secondary | ICD-10-CM

## 2019-11-25 DIAGNOSIS — Z01419 Encounter for gynecological examination (general) (routine) without abnormal findings: Secondary | ICD-10-CM | POA: Diagnosis not present

## 2019-11-25 DIAGNOSIS — G43009 Migraine without aura, not intractable, without status migrainosus: Secondary | ICD-10-CM | POA: Diagnosis not present

## 2019-11-25 DIAGNOSIS — R519 Headache, unspecified: Secondary | ICD-10-CM | POA: Diagnosis not present

## 2019-11-25 DIAGNOSIS — N926 Irregular menstruation, unspecified: Secondary | ICD-10-CM

## 2019-11-25 NOTE — Patient Instructions (Addendum)
For headaches, I'd like to send you back to neurology for evaluation. Let us know if worsening in the interim.  Pap today we will be in touch with results.  Return as needed or in 1 year for next physical.   Health Maintenance, Female Adopting a healthy lifestyle and getting preventive care are important in promoting health and wellness. Ask your health care provider about:  The right schedule for you to have regular tests and exams.  Things you can do on your own to prevent diseases and keep yourself healthy. What should I know about diet, weight, and exercise? Eat a healthy diet   Eat a diet that includes plenty of vegetables, fruits, low-fat dairy products, and lean protein.  Do not eat a lot of foods that are high in solid fats, added sugars, or sodium. Maintain a healthy weight Body mass index (BMI) is used to identify weight problems. It estimates body fat based on height and weight. Your health care provider can help determine your BMI and help you achieve or maintain a healthy weight. Get regular exercise Get regular exercise. This is one of the most important things you can do for your health. Most adults should:  Exercise for at least 150 minutes each week. The exercise should increase your heart rate and make you sweat (moderate-intensity exercise).  Do strengthening exercises at least twice a week. This is in addition to the moderate-intensity exercise.  Spend less time sitting. Even light physical activity can be beneficial. Watch cholesterol and blood lipids Have your blood tested for lipids and cholesterol at 25 years of age, then have this test every 5 years. Have your cholesterol levels checked more often if:  Your lipid or cholesterol levels are high.  You are older than 25 years of age.  You are at high risk for heart disease. What should I know about cancer screening? Depending on your health history and family history, you may need to have cancer screening at  various ages. This may include screening for:  Breast cancer.  Cervical cancer.  Colorectal cancer.  Skin cancer.  Lung cancer. What should I know about heart disease, diabetes, and high blood pressure? Blood pressure and heart disease  High blood pressure causes heart disease and increases the risk of stroke. This is more likely to develop in people who have high blood pressure readings, are of African descent, or are overweight.  Have your blood pressure checked: ? Every 3-5 years if you are 76-9 years of age. ? Every year if you are 23 years old or older. Diabetes Have regular diabetes screenings. This checks your fasting blood sugar level. Have the screening done:  Once every three years after age 52 if you are at a normal weight and have a low risk for diabetes.  More often and at a younger age if you are overweight or have a high risk for diabetes. What should I know about preventing infection? Hepatitis B If you have a higher risk for hepatitis B, you should be screened for this virus. Talk with your health care provider to find out if you are at risk for hepatitis B infection. Hepatitis C Testing is recommended for:  Everyone born from [birth year] through 1966.  Anyone with known risk factors for hepatitis C. Sexually transmitted infections (STIs)  Get screened for STIs, including gonorrhea and chlamydia, if: ? You are sexually active and are younger than 25 years of age. ? You are older than 25 years of age and  your health care provider tells you that you are at risk for this type of infection. ? Your sexual activity has changed since you were last screened, and you are at increased risk for chlamydia or gonorrhea. Ask your health care provider if you are at risk.  Ask your health care provider about whether you are at high risk for HIV. Your health care provider may recommend a prescription medicine to help prevent HIV infection. If you choose to take medicine to prevent  HIV, you should first get tested for HIV. You should then be tested every 3 months for as long as you are taking the medicine. Pregnancy  If you are about to stop having your period (premenopausal) and you may become pregnant, seek counseling before you get pregnant.  Take 400 to 800 micrograms (mcg) of folic acid every day if you become pregnant.  Ask for birth control (contraception) if you want to prevent pregnancy. Osteoporosis and menopause Osteoporosis is a disease in which the bones lose minerals and strength with aging. This can result in bone fractures. If you are 17 years old or older, or if you are at risk for osteoporosis and fractures, ask your health care provider if you should:  Be screened for bone loss.  Take a calcium or vitamin D supplement to lower your risk of fractures.  Be given hormone replacement therapy (HRT) to treat symptoms of menopause. Follow these instructions at home: Lifestyle  Do not use any products that contain nicotine or tobacco, such as cigarettes, e-cigarettes, and chewing tobacco. If you need help quitting, ask your health care provider.  Do not use street drugs.  Do not share needles.  Ask your health care provider for help if you need support or information about quitting drugs. Alcohol use  Do not drink alcohol if: ? Your health care provider tells you not to drink. ? You are pregnant, may be pregnant, or are planning to become pregnant.  If you drink alcohol: ? Limit how much you use to 0-1 drink a day. ? Limit intake if you are breastfeeding.  Be aware of how much alcohol is in your drink. In the [place] one drink equals one 12 oz bottle of beer (355 mL), one 5 oz glass of wine (148 mL), or one 1 oz glass of hard liquor (44 mL). General instructions  Schedule regular health, dental, and eye exams.  Stay current with your vaccines.  Tell your health care provider if: ? You often feel depressed. ? You have ever been abused or do  not feel safe at home. Summary  Adopting a healthy lifestyle and getting preventive care are important in promoting health and wellness.  Follow your health care provider's instructions about healthy diet, exercising, and getting tested or screened for diseases.  Follow your health care provider's instructions on monitoring your cholesterol and blood pressure. This information is not intended to replace advice given to you by your health care provider. Make sure you discuss any questions you have with your health care provider. Document Revised: 04/25/2018 Document Reviewed: 04/25/2018 Elsevier Patient Education  2020 [employer].

## 2019-11-25 NOTE — Progress Notes (Signed)
This visit was conducted in person.  BP 118/64 (BP Location: Left Arm, Patient Position: Sitting, Cuff Size: Normal)   Pulse 91   Temp 97.7 F (36.5 C) (Temporal)   Ht 5' 3.5" (1.613 m)   Wt 144 lb 9 oz (65.6 kg)   LMP 11/04/2019   SpO2 100%   BMI 25.21 kg/m    CC: CPE Subjective:    Patient ID: Shannon Mcdowell, female    DOB: April 14, 1996, 24 y.o.   MRN: [id number]  HPI: Shannon Mcdowell is a 24 y.o. female presenting on 11/25/2019 for Annual Exam   Graduated criminal justice bachelors. 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   Recurrent BV seen by GYN - treated with cleocin cream with improvement. No recent flare. Some lower abdominal cramping/discomfort intermittently present last week. Possible vaginal discharge - more swimming in pools recently.   Migraines - stable period without any recently. Intermittent mild headaches managed with ibuprofen. Over last 3 months noticing severe sharp activity limiting frontal head pain that lasts 15 seconds, 8-9/10 pain. Can happen with laughing, or randomly. Also feels pulsing sensation to back of head. No known fmhx aneurysm. No vision changes. Notes trouble with inattention. MRI brain with/without contrast impression: Two small foci of T2 and FLAIR hyperintensity in the subcortical white matter on the LEFT. Consider complicated migraine as an etiology. No acute intracranial findings. Electronically Signed   By: Davonna BellingJohn  Curnes M.D.   On: 10/03/2014 14:58  Preventative: Well woman exam -normal pap smear, neg HPV 4/018. Rpt today.  Completed HPV immunization series Tdap 2008, 2018  COVID vaccine - declines for now  LMP 11/04/2019, regular monthly Seat belt use discussed  Sunscreen use discussed. No changing moles on skin.  Non smoker. Roommate and GF smokes outdoors.  Alcohol - seldom Dentist q6 mo  Eye exam Q2 yrs  Caffeine: <1 cup day Lives withroom mate and dog Has an older brother. 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 Sleeping >7 hours a night.  Activity: Walking regularly with dog.  Diet: good water, fruits/vegetables daily     Relevant past medical, surgical, family and social history reviewed and updated as indicated. Interim medical history since our last visit reviewed. Allergies and medications reviewed and updated. Outpatient Medications Prior to Visit  Medication Sig Dispense Refill  . cyclobenzaprine (FLEXERIL) 5 MG tablet Take 0.5-1 tablets (2.5-5 mg total) by mouth 2 (two) times daily as needed (migraine headache - take at onset, sedation precautions). 30 tablet 1  . hydrOXYzine (ATARAX/VISTARIL) 10 MG tablet Take 0.5-1 tablets (5-10 mg total) by mouth 2 (two) times daily as needed for anxiety. 30 tablet 1  . metroNIDAZOLE (METROGEL VAGINAL) 0.75 % vaginal gel Place 1 Applicatorful vaginally at bedtime. For 5 days then preventatively twice weekly for 3 months 70 g 1   No facility-administered medications prior to visit.     Per HPI unless specifically indicated in ROS section below Review of Systems  Constitutional: Negative for activity change, appetite change, chills, fatigue, fever and unexpected weight change.  HENT: Negative for hearing loss.   Eyes: Negative for visual disturbance.  Respiratory: Negative for cough, chest tightness, shortness of breath and wheezing.   Cardiovascular: Negative for chest pain, palpitations and leg swelling.  Gastrointestinal: Positive for abdominal pain (cramping). Negative for abdominal distention, blood in stool, constipation, diarrhea, nausea and vomiting.  Genitourinary: Negative for difficulty urinating and hematuria.  Musculoskeletal: Negative for arthralgias, myalgias and neck pain.  Skin: Negative for rash.  Neurological: Positive for headaches. Negative for dizziness, seizures and syncope.  Hematological: Negative for adenopathy. Does not bruise/bleed  easily.  Psychiatric/Behavioral: Negative for dysphoric mood. The patient is not nervous/anxious.    Objective:  BP 118/64 (BP Location: Left Arm, Patient Position: Sitting, Cuff Size: Normal)   Pulse 91   Temp 97.7 F (36.5 C) (Temporal)   Ht 5' 3.5" (1.613 m)   Wt 144 lb 9 oz (65.6 kg)   LMP 11/04/2019   SpO2 100%   BMI 25.21 kg/m   Wt Readings from Last 3 Encounters:  11/25/19 144 lb 9 oz (65.6 kg)  08/22/19 136 lb (61.7 kg)  06/01/18 127 lb (57.6 kg)      Physical Exam Vitals and nursing note reviewed. Exam conducted with a chaperone present.  Constitutional:      General: She is not in acute distress.    Appearance: Normal appearance. She is well-developed. She is obese. She is not ill-appearing.  HENT:     Head: Normocephalic and atraumatic.     Right Ear: Hearing, tympanic membrane, ear canal and external ear normal.     Left Ear: Hearing, tympanic membrane, ear canal and external ear normal.  Eyes:     General: No scleral icterus.    Conjunctiva/sclera: Conjunctivae normal.     Pupils: Pupils are equal, round, and reactive to light.  Cardiovascular:     Rate and Rhythm: Normal rate and regular rhythm.     Pulses: Normal pulses.          Radial pulses are 2+ on the right side and 2+ on the left side.     Heart sounds: Normal heart sounds. No murmur heard.   Pulmonary:     Effort: Pulmonary effort is normal. No respiratory distress.     Breath sounds: Normal breath sounds. No wheezing, rhonchi or rales.  Abdominal:     General: Abdomen is flat. Bowel sounds are normal. There is no distension.     Palpations: Abdomen is soft. There is no mass.     Tenderness: There is no abdominal tenderness. There is no guarding or rebound.     Hernia: No hernia is present.  Genitourinary:    General: Normal vulva.     Exam position: Supine.     Pubic Area: No rash.      Labia:        Right: No rash, tenderness or lesion.        Left: No rash, tenderness or lesion.       Vagina: Normal.     Cervix: Erythema (mild) present. No discharge, lesion or cervical bleeding.     Uterus: Normal.      Adnexa: Right adnexa normal and left adnexa normal.     Comments: Pap performed on cervix  Musculoskeletal:        General: Normal range of motion.     Cervical back: Normal range of motion and neck supple.     Right lower leg: No edema.     Left lower leg: No edema.  Lymphadenopathy:     Cervical: No cervical adenopathy.  Skin:    General: Skin is warm and dry.     Findings: No rash.  Neurological:     General: No focal deficit present.     Mental Status: She is alert and oriented to person, place, and time.     Cranial Nerves: No  cranial nerve deficit.     Sensory: No sensory deficit.     Motor: Motor function is intact.     Coordination: Coordination is intact. Coordination normal. Finger-Nose-Finger Test normal.     Gait: Gait normal.     Comments:  CN 2-12 intact FTN intact EOMI  Psychiatric:        Mood and Affect: Mood normal.        Behavior: Behavior normal.        Thought Content: Thought content normal.        Judgment: Judgment normal.       Results for orders placed or performed in visit on 11/19/19  C. trachomatis/N. gonorrhoeae RNA   Specimen: Blood  Result Value Ref Range   C. trachomatis RNA, TMA NOT DETECTED NOT DETECT   N. gonorrhoeae RNA, TMA NOT DETECTED NOT DETECT  Basic metabolic panel  Result Value Ref Range   Sodium 140 135 - 145 mEq/L   Potassium 4.6 3.5 - 5.1 mEq/L   Chloride 105 96 - 112 mEq/L   CO2 25 19 - 32 mEq/L   Glucose, Bld 94 70 - 99 mg/dL   BUN 8 6 - 23 mg/dL   Creatinine, Ser 8.54 0.40 - 1.20 mg/dL   GFR 627.03 >50.09 mL/min   Calcium 9.4 8.4 - 10.5 mg/dL  RPR  Result Value Ref Range   RPR Ser Ql NON-REACTIVE NON-REACTI  HIV Antibody (routine testing w rflx)  Result Value Ref Range   HIV 1&2 Ab, 4th Generation NON-REACTIVE NON-REACTI   Lab Results  Component Value Date   TSH 0.87 08/17/2016    Lab  Results  Component Value Date   WBC 5.8 08/17/2016   HGB 13.4 08/17/2016   HCT 38.7 08/17/2016   MCV 96.9 08/17/2016   PLT 209.0 08/17/2016    Assessment & Plan:  This visit occurred during the SARS-CoV-2 public health emergency.  Safety protocols were in place, including screening questions prior to the visit, additional usage of staff PPE, and extensive cleaning of exam room while observing appropriate contact time as indicated for disinfecting solutions.   Problem List Items Addressed This Visit    Migraine headache    Stable period managed with flexeril abortively. No recent migraines.       Irregular periods/menstrual cycles    LMP 11/04/2019, now regular monthly      Health maintenance examination - Primary    Preventative protocols reviewed and updated unless pt declined. Discussed healthy diet and lifestyle.       Generalized headaches    New description of headache - severe sharp fleeting pain lasting seconds. Prior brain MRI 2016 ?complicated migraine etiology - will refer back to neurology for further evaluation of new headaches. nonfocal neurological exam today.       Relevant Orders   Ambulatory referral to Neurology    Other Visit Diagnoses    Encounter for annual routine gynecological examination       Relevant Orders   Cytology - PAP   Wet prep  (BD Affirm) ([place])       No orders of the defined types were placed in this encounter.  Orders Placed This Encounter  Procedures  . Ambulatory referral to Neurology    Referral Priority:   Routine    Referral Type:   Consultation    Referral Reason:   Specialty Services Required    Requested Specialty:   Neurology    Number of Visits Requested:   1  Patient instructions: For headaches, I'd like to send you back to neurology for evaluation. Let us know if worsening in the interim.  Pap today we will be in touch with results.  Return as needed or in 1 year for next physical.   Follow up  plan: Return in about 1 year (around 11/24/2020) for annual exam, prior fasting for blood work.  Eustaquio Boyden, MD

## 2019-11-27 ENCOUNTER — Encounter: Payer: Self-pay | Admitting: Neurology

## 2019-11-27 NOTE — Assessment & Plan Note (Signed)
LMP 11/04/2019, now regular monthly

## 2019-11-27 NOTE — Assessment & Plan Note (Signed)
Preventative protocols reviewed and updated unless pt declined. Discussed healthy diet and lifestyle.  

## 2019-11-27 NOTE — Assessment & Plan Note (Signed)
Stable period managed with flexeril abortively. No recent migraines.

## 2019-11-27 NOTE — Assessment & Plan Note (Signed)
New description of headache - severe sharp fleeting pain lasting seconds. Prior brain MRI 2016 ?complicated migraine etiology - will refer back to neurology for further evaluation of new headaches. nonfocal neurological exam today.

## 2019-11-29 ENCOUNTER — Encounter: Payer: Self-pay | Admitting: Family Medicine

## 2019-11-29 LAB — CYTOLOGY - PAP
Comment: NEGATIVE
Comment: NEGATIVE
Diagnosis: NEGATIVE
High risk HPV: NEGATIVE
Trichomonas: NEGATIVE

## 2019-12-03 MED ORDER — METRONIDAZOLE 500 MG PO TABS: 500.0000 mg | ORAL_TABLET | Freq: Two times a day (BID) | ORAL | 0 refills | Status: DC

## 2019-12-03 NOTE — Telephone Encounter (Signed)
Treating possible strawberry cervix seen on exam with flagyl course.

## 2019-12-03 NOTE — Addendum Note (Signed)
Addended by: Eustaquio Boyden on: 12/03/2019 04:05 PM   Modules accepted: Orders

## 2020-02-03 ENCOUNTER — Ambulatory Visit: Payer: 59 | Admitting: Neurology

## 2020-02-26 NOTE — Progress Notes (Addendum)
NEUROLOGY CONSULTATION NOTE  Shannon Mcdowell MRN: [id number] DOB: Apr 27, 1996  Referring provider: Eustaquio Boyden, MD Primary care provider: Eustaquio Boyden, MD  Reason for consult:  migraines  HISTORY OF PRESENT ILLNESS: Shannon Mcdowell is a 24 year old right-handed female who presents for migraines.  History supplemented by referring provider's note.  I previously saw her once in initial consultation back in May 2016.  In summery:  Onset:  Has had headaches on and off since adolescence.  She developed a new chronic daily headache in November 2015.  There was no preceding trigger.  At that time, she endorsed a constant pain of variable location (either side or holocephalic) which felt like "something eating my brain".  No aura.  Associated symptoms included nausea, photophobia, phonophobia and blurred vision.  She also has had increase in anxiety and one time noted episode of lightheadedness with slurred speech.  She was started on amitriptyline 50mg  but doesn't remember ever starting it.  I ordered an MRI of brain with and without contrast performed on 10/03/2014 which demonstrated two small nonenhancing T2/FLAIR foci in the left subcortical cerebral white matter but otherwise unremarkable.  She had an eye exam at that time, which was reportedly unremarkable. Migraines now occur once a month to every 2 months.  She treats with Flexeril and lasts until she falls asleep.  Around April 2021, she began having a new headache.  She describes a severe sharp paroxysmal or pulsating holocephalic headache lasting up to 15 seconds.  It occurs once or twice a month.  It may be elicited by laughing  but also occurs spontaneously.    Past abortive therapy:  Advil, Aleve, Excedrin Migraine Past preventative therapy:  amitriptyline 50mg   Current abortive therapy:  Cyclobenzaprine 10mg  (variable efficacy) Current preventative therapy:  none  Caffeine:  Drinks coffee but not daily.  No longer  drinks Dr. 06-10-1969 Alcohol:  occasionally Smoker:  socially Diet:  Drinks more water. Exercise:  Not routine Depression/stress:  Significant for anxiety Sleep hygiene:  Good Family history of headache:  No.  No family history of aneurysms.  PAST MEDICAL HISTORY: Past Medical History:  Diagnosis Date  . Depression with anxiety   . Generalized headaches    occasional  . GERD (gastroesophageal reflux disease)   . History of chicken pox   . Hx of migraines   . Influenza B 08/18/2016    PAST SURGICAL HISTORY: No past surgical history on file.  MEDICATIONS: Current Outpatient Medications on File Prior to Visit  Medication Sig Dispense Refill  . cyclobenzaprine (FLEXERIL) 5 MG tablet Take 0.5-1 tablets (2.5-5 mg total) by mouth 2 (two) times daily as needed (migraine headache - take at onset, sedation precautions). 30 tablet 1  . hydrOXYzine (ATARAX/VISTARIL) 10 MG tablet Take 0.5-1 tablets (5-10 mg total) by mouth 2 (two) times daily as needed for anxiety. 30 tablet 1  . metroNIDAZOLE (FLAGYL) 500 MG tablet Take 1 tablet (500 mg total) by mouth 2 (two) times daily. 14 tablet 0  . metroNIDAZOLE (METROGEL VAGINAL) 0.75 % vaginal gel Place 1 Applicatorful vaginally at bedtime. For 5 days then preventatively twice weekly for 3 months 70 g 1   No current facility-administered medications on file prior to visit.    ALLERGIES: No Known Allergies  FAMILY HISTORY: Family History  Problem Relation Age of Onset  . Cancer Paternal Grandfather        leukemia  . Cancer Paternal Grandmother        breast& lung  .  Hypertension Father   . Thyroid disease Father   . CAD Other        mat great grandmother  . Diabetes Maternal Grandfather   . Mental illness Maternal Grandfather        suicide (shot himself)  . Anxiety disorder Brother   . Stroke Neg Hx     SOCIAL HISTORY: Social History   Socioeconomic History  . Marital status: Single    Spouse name: Not on file  . Number of  children: Not on file  . Years of education: Not on file  . Highest education level: Not on file  Occupational History  . Not on file  Tobacco Use  . Smoking status: Former Games developer  . Smokeless tobacco: Never Used  Vaping Use  . Vaping Use: Never used  Substance and Sexual Activity  . Alcohol use: Yes    Alcohol/week: 0.0 standard drinks    Comment: social   . Drug use: Yes    Comment: h/o MJ  . Sexual activity: Yes    Partners: Female    Birth control/protection: None  Other Topics Concern  . Not on file  Social History Narrative   Caffeine: lots of mountain dew/pepsi   Lives with father alternating with mother.     Older brother.   Occupation: Airline pilot   Activity: no regular exercise   Diet: good water, fruits/vegetables daily   Social Determinants of Health   Financial Resource Strain:   . Difficulty of Paying Living Expenses: Not on file  Food Insecurity:   . Worried About Programme researcher, broadcasting/film/video in the Last Year: Not on file  . Ran Out of Food in the Last Year: Not on file  Transportation Needs:   . Lack of Transportation (Medical): Not on file  . Lack of Transportation (Non-Medical): Not on file  Physical Activity:   . Days of Exercise per Week: Not on file  . Minutes of Exercise per Session: Not on file  Stress:   . Feeling of Stress : Not on file  Social Connections:   . Frequency of Communication with Friends and Family: Not on file  . Frequency of Social Gatherings with Friends and Family: Not on file  . Attends Religious Services: Not on file  . Active Member of Clubs or Organizations: Not on file  . Attends Banker Meetings: Not on file  . Marital Status: Not on file  Intimate Partner Violence:   . Fear of Current or Ex-Partner: Not on file  . Emotionally Abused: Not on file  . Physically Abused: Not on file  . Sexually Abused: Not on file    PHYSICAL EXAM: Blood pressure 119/76, pulse (!) 104, height 5\' 4"  (1.626 m), weight 139 lb 6.4 oz  (63.2 kg), SpO2 100 %.' General: No acute distress.  Patient appears well-groomed.  Head:  Normocephalic/atraumatic Eyes:  fundi examined but not visualized Neck: supple, no paraspinal tenderness, full range of motion Back: No paraspinal tenderness Heart: regular rate and rhythm Lungs: Clear to auscultation bilaterally. Vascular: No carotid bruits. Neurological Exam: Mental status: alert and oriented to person, place, and time, recent and remote memory intact, fund of knowledge intact, attention and concentration intact, speech fluent and not dysarthric, language intact. Cranial nerves: CN I: not tested CN II: pupils equal, round and reactive to light, visual fields intact CN III, IV, VI:  full range of motion, no nystagmus, no ptosis CN V: facial sensation intact CN VII: upper and lower face symmetric  CN VIII: hearing intact CN IX, X: gag intact, uvula midline CN XI: sternocleidomastoid and trapezius muscles intact CN XII: tongue midline Bulk & Tone: normal, no fasciculations. Motor:  5/5 throughout  Sensation: temperature and vibration sensation intact. Deep Tendon Reflexes:  2+ throughout, toes downgoing.  Finger to nose testing:  Without dysmetria.  Heel to shin:  Without dysmetria.  Gait:  Normal station and stride.  Able to turn and tandem walk. Romberg negative .  IMPRESSION: 1.  New onset headache, paroxysmal holocephalic headache.  Unclear headache syndrome.  As this is new, secondary etiology should be ruled out.  2.  Migraine, stable  PLAN: 1.  MRI and MRA of brain.  Further recommendations pending results.  03/23/2020 ADDENDUM:  MRI/MRA of brain unremarkable.  Thank you for allowing me to take part in the care of this patient.  Shon Millet, DO  CC: Eustaquio Boyden, MD

## 2020-02-28 ENCOUNTER — Other Ambulatory Visit: Payer: Self-pay

## 2020-02-28 ENCOUNTER — Encounter: Payer: Self-pay | Admitting: Neurology

## 2020-02-28 ENCOUNTER — Ambulatory Visit (INDEPENDENT_AMBULATORY_CARE_PROVIDER_SITE_OTHER): Payer: 59 | Admitting: Neurology

## 2020-02-28 ENCOUNTER — Ambulatory Visit: Payer: 59 | Admitting: Neurology

## 2020-02-28 VITALS — BP 119/76 | HR 104 | Ht 64.0 in | Wt 139.4 lb

## 2020-02-28 DIAGNOSIS — R519 Headache, unspecified: Secondary | ICD-10-CM

## 2020-02-28 DIAGNOSIS — G43009 Migraine without aura, not intractable, without status migrainosus: Secondary | ICD-10-CM

## 2020-02-28 NOTE — Patient Instructions (Signed)
Will check MRI and MRA of brain Further recommendations pending results.

## 2020-03-21 ENCOUNTER — Other Ambulatory Visit: Payer: Self-pay

## 2020-03-21 ENCOUNTER — Ambulatory Visit
Admission: RE | Admit: 2020-03-21 | Discharge: 2020-03-21 | Disposition: A | Discharge status: Home / Self Care | Payer: 59 | Source: Ambulatory Visit | Attending: Neurology | Admitting: Neurology

## 2020-03-21 DIAGNOSIS — R519 Headache, unspecified: Secondary | ICD-10-CM

## 2020-03-23 ENCOUNTER — Telehealth: Payer: Self-pay

## 2020-03-23 NOTE — Telephone Encounter (Signed)
-----   Message from Drema Dallas, DO sent at 03/23/2020  7:17 AM EST ----- MRI and MRA of brain show nothing concerning.  Two very tiny spots are seen, which was there back in 2016.  This is benign and may be seen in people with migraine.  No brain tumor or aneurysm noted.

## 2020-03-23 NOTE — Telephone Encounter (Signed)
Called patient and informed her of results. Patient verbalized understanding. 

## 2020-04-16 ENCOUNTER — Telehealth (INDEPENDENT_AMBULATORY_CARE_PROVIDER_SITE_OTHER): Payer: 59 | Admitting: Family Medicine

## 2020-04-16 ENCOUNTER — Encounter: Payer: Self-pay | Admitting: Family Medicine

## 2020-04-16 VITALS — Ht 63.5 in

## 2020-04-16 DIAGNOSIS — R059 Cough, unspecified: Secondary | ICD-10-CM

## 2020-04-16 DIAGNOSIS — J029 Acute pharyngitis, unspecified: Secondary | ICD-10-CM

## 2020-04-16 MED ORDER — AMOXICILLIN 875 MG PO TABS: 875.0000 mg | ORAL_TABLET | Freq: Two times a day (BID) | ORAL | 0 refills | Status: AC

## 2020-04-16 NOTE — Progress Notes (Signed)
Farron Watrous T. Jaelee Laughter, MD Primary Care and Sports Medicine [facility] [address] [place], [address] Phone: [phone]  FAX: [phone]  DELESHA POHLMAN - 24 y.o. female  MRN [id number]  Date of Birth: 12/27/1995  Visit Date: 04/16/2020  PCP: Eustaquio Boyden, MD  Referred by: Eustaquio Boyden, MD Chief Complaint  Patient presents with  . Sore Throat    Started on Sunday  . Cough   Virtual Visit via Video Note:  I connected with  Luna Glasgow on 04/16/2020  3:40 PM EST by a video enabled telemedicine application and verified that I am speaking with the correct person using two identifiers.   Location patient: home computer, tablet, or smartphone Location provider: work or home office Consent: Verbal consent directly obtained from Luna Glasgow. Persons participating in the virtual visit: patient, provider  I discussed the limitations of evaluation and management by telemedicine and the availability of in person appointments. The patient expressed understanding and agreed to proceed.  Interactive audio and video telecommunications were attempted between this provider and patient, however failed, due to patient having technical difficulties OR patient did not have access to video capability.  We continued and completed visit with audio only.   History of Present Illness:  She is a very nice 24 year old lady and she presents for a MyChart visit with some acute symptoms.  She is having some sore throat and a cough.  I did have to convert this to a phone visit.  Sat night and Monday morning and slight cough.  Does not feel that bad.  Breathing will take metallic.   2 at-home covid tests.  She also fairly recently had COVID-19 infection.  She predominantly has a sore throat, sore throat with swallowing, sore throat with eating anything whatsoever and sore throat with drinking.  She also has a mild cough.  She denies any fever.   She does note that she has a metallic taste.  Review of Systems as above: See pertinent positives and pertinent negatives per HPI No acute distress verbally   Observations/Objective/Exam:  An attempt was made to discern vital signs over the phone and per patient if applicable and possible.   General:    Alert, Oriented, appears well and in no acute distress  Pulmonary:     On inspection no signs of respiratory distress.  Psych / Neurological:     Pleasant and cooperative.  Assessment and Plan:    ICD-10-CM   1. Cough  R05.9   2. Sore throat  J02.9    Total encounter time: 10 minutes. This includes total time spent on the day of encounter.  Primary sore throat, certainly this could be viral pharyngitis.  Cannot exclude strep throat, and I am could not treat her with amoxicillin and continue with supportive care.  I discussed the assessment and treatment plan with the patient. The patient was provided an opportunity to ask questions and all were answered. The patient agreed with the plan and demonstrated an understanding of the instructions.   The patient was advised to call back or seek an in-person evaluation if the symptoms worsen or if the condition fails to improve as anticipated.  Follow-up: prn unless noted otherwise below No follow-ups on file.  Meds ordered this encounter  Medications  . amoxicillin (AMOXIL) 875 MG tablet    Sig: Take 1 tablet (875 mg total) by mouth 2 (two) times daily for 10 days.  Dispense:  20 tablet    Refill:  0   No orders of the defined types were placed in this encounter.   Signed,  Maud Deed. Skylur Fuston, MD

## 2020-05-16 ENCOUNTER — Encounter: Payer: Self-pay | Admitting: Family Medicine

## 2020-05-17 NOTE — Telephone Encounter (Signed)
COVID positive First day of symptoms 05/15/2020.  plz call for update on symptoms and place on COVID call list. Let me know if she'd like to be set up for virtual visit.

## 2020-05-26 ENCOUNTER — Encounter: Payer: Self-pay | Admitting: Family Medicine

## 2020-11-27 ENCOUNTER — Encounter: Payer: 59 | Admitting: Family Medicine

## 2020-12-14 ENCOUNTER — Other Ambulatory Visit: Payer: Self-pay

## 2020-12-15 ENCOUNTER — Ambulatory Visit (INDEPENDENT_AMBULATORY_CARE_PROVIDER_SITE_OTHER): Payer: 59 | Admitting: Family Medicine

## 2020-12-15 ENCOUNTER — Encounter: Payer: Self-pay | Admitting: Family Medicine

## 2020-12-15 VITALS — BP 110/70 | HR 107 | Temp 97.6°F | Ht 64.0 in | Wt 136.4 lb

## 2020-12-15 DIAGNOSIS — Z0000 Encounter for general adult medical examination without abnormal findings: Secondary | ICD-10-CM

## 2020-12-15 DIAGNOSIS — M545 Low back pain, unspecified: Secondary | ICD-10-CM | POA: Diagnosis not present

## 2020-12-15 DIAGNOSIS — N949 Unspecified condition associated with female genital organs and menstrual cycle: Secondary | ICD-10-CM

## 2020-12-15 DIAGNOSIS — F4323 Adjustment disorder with mixed anxiety and depressed mood: Secondary | ICD-10-CM

## 2020-12-15 DIAGNOSIS — B9689 Other specified bacterial agents as the cause of diseases classified elsewhere: Secondary | ICD-10-CM

## 2020-12-15 DIAGNOSIS — N760 Acute vaginitis: Secondary | ICD-10-CM

## 2020-12-15 DIAGNOSIS — G43009 Migraine without aura, not intractable, without status migrainosus: Secondary | ICD-10-CM

## 2020-12-15 LAB — POC URINALSYSI DIPSTICK (AUTOMATED)
Bilirubin, UA: NEGATIVE
Blood, UA: NEGATIVE
Glucose, UA: NEGATIVE
Nitrite, UA: NEGATIVE
Protein, UA: POSITIVE — AB
Spec Grav, UA: 1.02 (ref 1.010–1.025)
Urobilinogen, UA: 1 E.U./dL
pH, UA: 7 (ref 5.0–8.0)

## 2020-12-15 MED ORDER — CLINDAMYCIN PHOSPHATE 2 % VA CREA: 1.0000 | TOPICAL_CREAM | Freq: Every day | VAGINAL | 0 refills | Status: AC

## 2020-12-15 MED ORDER — CYCLOBENZAPRINE HCL 5 MG PO TABS: 2.5000 mg | ORAL_TABLET | Freq: Two times a day (BID) | ORAL | 1 refills | Status: AC | PRN

## 2020-12-15 MED ORDER — HYDROXYZINE HCL 10 MG PO TABS: 5.0000 mg | ORAL_TABLET | Freq: Two times a day (BID) | ORAL | 1 refills | Status: DC | PRN

## 2020-12-15 NOTE — Assessment & Plan Note (Signed)
UA suspicious for BV given clue cells present.  UCx not sent given significant epithelial cells present.  Rx clindamycin cream.  Last BV 2020.

## 2020-12-15 NOTE — Assessment & Plan Note (Signed)
Stable period on rare hydroxyzine use - refilled.

## 2020-12-15 NOTE — Addendum Note (Signed)
Addended by: Alvina Chou on: 12/15/2020 11:47 AM   Modules accepted: Orders

## 2020-12-15 NOTE — Assessment & Plan Note (Signed)
Stable period on PRN flexeril abortively.

## 2020-12-15 NOTE — Patient Instructions (Signed)
Testing today suspicious for BV - treat with clindamycin cream sent to pharmacy  Good to see you today.  Return as needed or in 1 year for next physical.  Health Maintenance, Female Adopting a healthy lifestyle and getting preventive care are important in promoting health and wellness. Ask your health care provider about: The right schedule for you to have regular tests and exams. Things you can do on your own to prevent diseases and keep yourself healthy. What should I know about diet, weight, and exercise? Eat a healthy diet  Eat a diet that includes plenty of vegetables, fruits, low-fat dairy products, and lean protein. Do not eat a lot of foods that are high in solid fats, added sugars, or sodium.  Maintain a healthy weight Body mass index (BMI) is used to identify weight problems. It estimates body fat based on height and weight. Your health care provider can help determineyour BMI and help you achieve or maintain a healthy weight. Get regular exercise Get regular exercise. This is one of the most important things you can do for your health. Most adults should: Exercise for at least 150 minutes each week. The exercise should increase your heart rate and make you sweat (moderate-intensity exercise). Do strengthening exercises at least twice a week. This is in addition to the moderate-intensity exercise. Spend less time sitting. Even light physical activity can be beneficial. Watch cholesterol and blood lipids Have your blood tested for lipids and cholesterol at 27 years of age, then havethis test every 5 years. Have your cholesterol levels checked more often if: Your lipid or cholesterol levels are high. You are older than 26 years of age. You are at high risk for heart disease. What should I know about cancer screening? Depending on your health history and family history, you may need to have cancer screening at various ages. This may include screening for: Breast cancer. Cervical  cancer. Colorectal cancer. Skin cancer. Lung cancer. What should I know about heart disease, diabetes, and high blood pressure? Blood pressure and heart disease High blood pressure causes heart disease and increases the risk of stroke. This is more likely to develop in people who have high blood pressure readings, are of African descent, or are overweight. Have your blood pressure checked: Every 3-5 years if you are 28-14 years of age. Every year if you are 74 years old or older. Diabetes Have regular diabetes screenings. This checks your fasting blood sugar level. Have the screening done: Once every three years after age 87 if you are at a normal weight and have a low risk for diabetes. More often and at a younger age if you are overweight or have a high risk for diabetes. What should I know about preventing infection? Hepatitis B If you have a higher risk for hepatitis B, you should be screened for this virus. Talk with your health care provider to find out if you are at risk forhepatitis B infection. Hepatitis C Testing is recommended for: Everyone born from [birth year] through 1966. Anyone with known risk factors for hepatitis C. Sexually transmitted infections (STIs) Get screened for STIs, including gonorrhea and chlamydia, if: You are sexually active and are younger than 26 years of age. You are older than 26 years of age and your health care provider tells you that you are at risk for this type of infection. Your sexual activity has changed since you were last screened, and you are at increased risk for chlamydia or gonorrhea. Ask your health care  provider if you are at risk. Ask your health care provider about whether you are at high risk for HIV. Your health care provider may recommend a prescription medicine to help prevent HIV infection. If you choose to take medicine to prevent HIV, you should first get tested for HIV. You should then be tested every 3 months for as long as you are  taking the medicine. Pregnancy If you are about to stop having your period (premenopausal) and you may become pregnant, seek counseling before you get pregnant. Take 400 to 800 micrograms (mcg) of folic acid every day if you become pregnant. Ask for birth control (contraception) if you want to prevent pregnancy. Osteoporosis and menopause Osteoporosis is a disease in which the bones lose minerals and strength with aging. This can result in bone fractures. If you are 94 years old or older, or if you are at risk for osteoporosis and fractures, ask your health care provider if you should: Be screened for bone loss. Take a calcium or vitamin D supplement to lower your risk of fractures. Be given hormone replacement therapy (HRT) to treat symptoms of menopause. Follow these instructions at home: Lifestyle Do not use any products that contain nicotine or tobacco, such as cigarettes, e-cigarettes, and chewing tobacco. If you need help quitting, ask your health care provider. Do not use street drugs. Do not share needles. Ask your health care provider for help if you need support or information about quitting drugs. Alcohol use Do not drink alcohol if: Your health care provider tells you not to drink. You are pregnant, may be pregnant, or are planning to become pregnant. If you drink alcohol: Limit how much you use to 0-1 drink a day. Limit intake if you are breastfeeding. Be aware of how much alcohol is in your drink. In the [place] one drink equals one 12 oz bottle of beer (355 mL), one 5 oz glass of wine (148 mL), or one 1 oz glass of hard liquor (44 mL). General instructions Schedule regular health, dental, and eye exams. Stay current with your vaccines. Tell your health care provider if: You often feel depressed. You have ever been abused or do not feel safe at home. Summary Adopting a healthy lifestyle and getting preventive care are important in promoting health and wellness. Follow  your health care provider's instructions about healthy diet, exercising, and getting tested or screened for diseases. Follow your health care provider's instructions on monitoring your cholesterol and blood pressure. This information is not intended to replace advice given to you by your health care provider. Make sure you discuss any questions you have with your healthcare provider. Document Revised: 04/25/2018 Document Reviewed: 04/25/2018 Elsevier Patient Education  2022 [employer].

## 2020-12-15 NOTE — Assessment & Plan Note (Signed)
Preventative protocols reviewed and updated unless pt declined. Discussed healthy diet and lifestyle.  

## 2020-12-15 NOTE — Progress Notes (Signed)
Patient ID: Shannon Mcdowell, female    DOB: [birth year], 25 y.o.   MRN: [id number]  This visit was conducted in person.  BP 110/70   Pulse (!) 107   Temp 97.6 F (36.4 C) (Temporal)   Ht 5\' 4"  (1.626 m)   Wt 136 lb 6 oz (61.9 kg)   LMP 11/23/2020   SpO2 98%   BMI 23.41 kg/m    CC: CPE Subjective:   HPI: Shannon Mcdowell is a 25 y.o. female presenting on 12/15/2020 for Annual Exam and Back Pain (C/o low back pain, cloudy urine and vaginal burning.  Sxs started 2 days ago. )   2d h/o low back pain associated with cloudy urine and vaginal burning. No dysuria, urgency or frequency. No hematuria. No fevers/chills, nausea/vomiting or flank pain or abd pain. No recent abx use. H/o recurrent BV saw GYN treated with cleocin.   Saw neurology last year for new type of headaches in h/o migraines - brain imaging returned reassuringly normal. Continues flexeril PRN migraine. Migraines improved as she's cut down on sodas.   Rare anxiety managed with PRN hydroxyzine.  New job in 02/14/2021 works for [employer] - she enjoys this.   Preventative: Well woman exam - always normal pap smears, latest normal 11/2019  Flu shot - declines COVID vaccine - declined  Completed HPV immunization series  Tdap 2008, 2018  LMP 11/23/2020, regular monthly Seat belt use discussed Sunscreen use discussed. No changing moles on skin. Non smoker. Roommate vapes Alcohol - seldom   Dentist q6 mo Eye exam Q2 yrs    Caffeine: <1 cup day Lives with room mate and dog  Has an older brother. 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 Sleeping >7 hours a night. Activity: Walking regularly with dog.  Diet: good water, fruits/vegetables daily      Relevant past medical, surgical, family and social history reviewed and updated as indicated. Interim medical history since our last visit reviewed. Allergies and medications reviewed and  updated. Outpatient Medications Prior to Visit  Medication Sig Dispense Refill   cyclobenzaprine (FLEXERIL) 5 MG tablet Take 0.5-1 tablets (2.5-5 mg total) by mouth 2 (two) times daily as needed (migraine headache - take at onset, sedation precautions). 30 tablet 1   hydrOXYzine (ATARAX/VISTARIL) 10 MG tablet Take 0.5-1 tablets (5-10 mg total) by mouth 2 (two) times daily as needed for anxiety. 30 tablet 1   No facility-administered medications prior to visit.     Per HPI unless specifically indicated in ROS section below Review of Systems  Constitutional:  Negative for activity change, appetite change, chills, fatigue, fever and unexpected weight change.  HENT:  Negative for hearing loss.   Eyes:  Negative for visual disturbance.  Respiratory:  Negative for cough, chest tightness, shortness of breath and wheezing.   Cardiovascular:  Negative for chest pain, palpitations and leg swelling.  Gastrointestinal:  Negative for abdominal distention, abdominal pain, blood in stool, constipation, diarrhea, nausea and vomiting.  Genitourinary:  Negative for difficulty urinating and hematuria.  Musculoskeletal:  Negative for arthralgias, myalgias and neck pain.  Skin:  Negative for rash.  Neurological:  Negative for dizziness, seizures, syncope and headaches.  Hematological:  Negative for adenopathy. Does not bruise/bleed easily.  Psychiatric/Behavioral:  Negative for dysphoric mood. The patient is not nervous/anxious.    Objective:  BP 110/70   Pulse (!) 107   Temp 97.6 F (36.4 C) (Temporal)  Ht 5\' 4"  (1.626 m)   Wt 136 lb 6 oz (61.9 kg)   LMP 11/23/2020   SpO2 98%   BMI 23.41 kg/m   Wt Readings from Last 3 Encounters:  12/15/20 136 lb 6 oz (61.9 kg)  02/28/20 139 lb 6.4 oz (63.2 kg)  11/25/19 144 lb 9 oz (65.6 kg)      Physical Exam Vitals and nursing note reviewed.  Constitutional:      Appearance: Normal appearance. She is not ill-appearing.  HENT:     Head: Normocephalic and  atraumatic.     Right Ear: Tympanic membrane, ear canal and external ear normal. There is no impacted cerumen.     Left Ear: Tympanic membrane, ear canal and external ear normal. There is no impacted cerumen.  Eyes:     General:        Right eye: No discharge.        Left eye: No discharge.     Extraocular Movements: Extraocular movements intact.     Conjunctiva/sclera: Conjunctivae normal.     Pupils: Pupils are equal, round, and reactive to light.  Neck:     Thyroid: No thyroid mass or thyromegaly.  Cardiovascular:     Rate and Rhythm: Normal rate and regular rhythm.     Pulses: Normal pulses.     Heart sounds: Normal heart sounds. No murmur heard. Pulmonary:     Effort: Pulmonary effort is normal. No respiratory distress.     Breath sounds: Normal breath sounds. No wheezing, rhonchi or rales.  Abdominal:     General: Bowel sounds are normal. There is no distension.     Palpations: Abdomen is soft. There is no mass.     Tenderness: There is no abdominal tenderness. There is no guarding or rebound.     Hernia: No hernia is present.  Genitourinary:    Comments: Self collection performed for wet prep Musculoskeletal:     Cervical back: Normal range of motion and neck supple. No rigidity.     Right lower leg: No edema.     Left lower leg: No edema.  Lymphadenopathy:     Cervical: No cervical adenopathy.  Skin:    General: Skin is warm and dry.     Findings: No rash.  Neurological:     General: No focal deficit present.     Mental Status: She is alert. Mental status is at baseline.  Psychiatric:        Mood and Affect: Mood normal.        Behavior: Behavior normal.      Results for orders placed or performed in visit on 12/15/20  POCT Urinalysis Dipstick (Automated)  Result Value Ref Range   Color, UA yellow    Clarity, UA cloudy    Glucose, UA Negative Negative   Bilirubin, UA negative    Ketones, UA 1+    Spec Grav, UA 1.020 1.010 - 1.025   Blood, UA negative     pH, UA 7.0 5.0 - 8.0   Protein, UA Positive (A) Negative   Urobilinogen, UA 1.0 0.2 or 1.0 E.U./dL   Nitrite, UA negative    Leukocytes, UA Large (3+) (A) Negative   Assessment & Plan:  This visit occurred during the SARS-CoV-2 public health emergency.  Safety protocols were in place, including screening questions prior to the visit, additional usage of staff PPE, and extensive cleaning of exam room while observing appropriate contact time as indicated for disinfecting solutions.   Problem  List Items Addressed This Visit     Adjustment disorder with mixed anxiety and depressed mood    Stable period on rare hydroxyzine use - refilled.        Migraine headache    Stable period on PRN flexeril abortively.        Relevant Medications   cyclobenzaprine (FLEXERIL) 5 MG tablet   Bacterial vaginosis    UA suspicious for BV given clue cells present.  UCx not sent given significant epithelial cells present.  Rx clindamycin cream.  Last BV 2020.        Health maintenance examination - Primary    Preventative protocols reviewed and updated unless pt declined. Discussed healthy diet and lifestyle.        Other Visit Diagnoses     Bilateral low back pain, unspecified chronicity, unspecified whether sciatica present       Relevant Medications   cyclobenzaprine (FLEXERIL) 5 MG tablet   Other Relevant Orders   POCT Urinalysis Dipstick (Automated) (Completed)   Vaginal burning       Relevant Orders   Wet prep, genital        Meds ordered this encounter  Medications   cyclobenzaprine (FLEXERIL) 5 MG tablet    Sig: Take 0.5-1 tablets (2.5-5 mg total) by mouth 2 (two) times daily as needed (migraine headache - take at onset, sedation precautions).    Dispense:  30 tablet    Refill:  1   hydrOXYzine (ATARAX/VISTARIL) 10 MG tablet    Sig: Take 0.5-1 tablets (5-10 mg total) by mouth 2 (two) times daily as needed for anxiety.    Dispense:  30 tablet    Refill:  1   clindamycin  (CLEOCIN) 2 % vaginal cream    Sig: Place 1 Applicatorful vaginally at bedtime for 7 days.    Dispense:  40 g    Refill:  0    Orders Placed This Encounter  Procedures   Wet prep, genital   POCT Urinalysis Dipstick (Automated)     Patient instructions: Testing today suspicious for BV - treat with clindamycin cream sent to pharmacy  Good to see you today.  Return as needed or in 1 year for next physical.  Follow up plan: Return in about 1 year (around 12/15/2021) for annual exam, prior fasting for blood work.  Eustaquio Boyden, MD

## 2020-12-16 ENCOUNTER — Encounter: Payer: Self-pay | Admitting: Family Medicine

## 2020-12-16 ENCOUNTER — Other Ambulatory Visit: Payer: Self-pay | Admitting: Family Medicine

## 2020-12-16 DIAGNOSIS — A5901 Trichomonal vulvovaginitis: Secondary | ICD-10-CM

## 2020-12-16 DIAGNOSIS — Z113 Encounter for screening for infections with a predominantly sexual mode of transmission: Secondary | ICD-10-CM

## 2020-12-16 LAB — WET PREP BY MOLECULAR PROBE
Candida species: DETECTED — AB
MICRO NUMBER:: 12191420
SPECIMEN QUALITY:: ADEQUATE
Trichomonas vaginosis: DETECTED — AB

## 2020-12-16 MED ORDER — METRONIDAZOLE 500 MG PO TABS: 500.0000 mg | ORAL_TABLET | Freq: Three times a day (TID) | ORAL | 0 refills | Status: AC

## 2020-12-16 MED ORDER — FLUCONAZOLE 150 MG PO TABS: 150.0000 mg | ORAL_TABLET | Freq: Once | ORAL | 0 refills | Status: AC

## 2020-12-16 NOTE — Telephone Encounter (Signed)
Replied via lab result note 

## 2020-12-18 ENCOUNTER — Other Ambulatory Visit (INDEPENDENT_AMBULATORY_CARE_PROVIDER_SITE_OTHER): Payer: 59

## 2020-12-18 ENCOUNTER — Other Ambulatory Visit: Payer: Self-pay

## 2020-12-18 ENCOUNTER — Other Ambulatory Visit: Payer: 59

## 2020-12-18 DIAGNOSIS — Z113 Encounter for screening for infections with a predominantly sexual mode of transmission: Secondary | ICD-10-CM | POA: Diagnosis not present

## 2020-12-18 NOTE — Telephone Encounter (Signed)
Spoke with pt scheduling 2 wk lab f/u on 12/31/20 at 4:00.

## 2020-12-18 NOTE — Telephone Encounter (Signed)
Plz schedule lab visit in 2 wks for rpt UA, and wet prep (ok to do self collection).

## 2020-12-21 LAB — C. TRACHOMATIS/N. GONORRHOEAE RNA
C. trachomatis RNA, TMA: NOT DETECTED
N. gonorrhoeae RNA, TMA: NOT DETECTED

## 2020-12-21 LAB — RPR: RPR Ser Ql: NONREACTIVE

## 2020-12-21 LAB — HIV ANTIBODY (ROUTINE TESTING W REFLEX): HIV 1&2 Ab, 4th Generation: NONREACTIVE

## 2020-12-22 ENCOUNTER — Other Ambulatory Visit: Payer: Self-pay | Admitting: Family Medicine

## 2020-12-22 NOTE — Telephone Encounter (Signed)
Ok to send 90-day rx? 

## 2020-12-22 NOTE — Telephone Encounter (Signed)
ERx 

## 2020-12-27 ENCOUNTER — Other Ambulatory Visit: Payer: Self-pay | Admitting: Family Medicine

## 2020-12-27 DIAGNOSIS — R809 Proteinuria, unspecified: Secondary | ICD-10-CM

## 2020-12-27 DIAGNOSIS — A5901 Trichomonal vulvovaginitis: Secondary | ICD-10-CM

## 2020-12-31 ENCOUNTER — Other Ambulatory Visit (INDEPENDENT_AMBULATORY_CARE_PROVIDER_SITE_OTHER): Payer: 59

## 2020-12-31 DIAGNOSIS — R809 Proteinuria, unspecified: Secondary | ICD-10-CM

## 2020-12-31 DIAGNOSIS — A5901 Trichomonal vulvovaginitis: Secondary | ICD-10-CM

## 2021-01-01 ENCOUNTER — Encounter: Payer: Self-pay | Admitting: Family Medicine

## 2021-01-01 LAB — URINALYSIS, ROUTINE W REFLEX MICROSCOPIC
Bilirubin Urine: NEGATIVE
Ketones, ur: 15 — AB
Nitrite: NEGATIVE
Specific Gravity, Urine: 1.01 (ref 1.000–1.030)
Total Protein, Urine: NEGATIVE
Urine Glucose: NEGATIVE
Urobilinogen, UA: 1 (ref 0.0–1.0)
pH: 7 (ref 5.0–8.0)

## 2021-01-01 LAB — WET PREP BY MOLECULAR PROBE
Candida species: DETECTED — AB
Gardnerella vaginalis: NOT DETECTED
MICRO NUMBER:: 12260883
SPECIMEN QUALITY:: ADEQUATE
Trichomonas vaginosis: NOT DETECTED

## 2021-01-03 ENCOUNTER — Other Ambulatory Visit: Payer: Self-pay | Admitting: Family Medicine

## 2021-01-03 MED ORDER — FLUCONAZOLE 150 MG PO TABS: 150.0000 mg | ORAL_TABLET | Freq: Once | ORAL | 0 refills | Status: AC

## 2021-01-05 NOTE — Telephone Encounter (Signed)
Replied via lab result  

## 2021-01-27 ENCOUNTER — Encounter: Payer: Self-pay | Admitting: Family Medicine

## 2021-03-31 ENCOUNTER — Encounter: Payer: Self-pay | Admitting: Family Medicine

## 2021-04-02 ENCOUNTER — Ambulatory Visit: Payer: 59 | Admitting: Family Medicine

## 2021-04-02 NOTE — Telephone Encounter (Signed)
Appt scheduled for today.

## 2021-04-07 ENCOUNTER — Encounter: Payer: Self-pay | Admitting: Family Medicine

## 2021-04-07 ENCOUNTER — Ambulatory Visit: Payer: 59 | Admitting: Family Medicine

## 2021-04-07 ENCOUNTER — Other Ambulatory Visit: Payer: Self-pay

## 2021-04-07 VITALS — BP 126/64 | HR 81 | Temp 98.0°F | Ht 64.0 in | Wt 137.2 lb

## 2021-04-07 DIAGNOSIS — N898 Other specified noninflammatory disorders of vagina: Secondary | ICD-10-CM

## 2021-04-07 DIAGNOSIS — Z113 Encounter for screening for infections with a predominantly sexual mode of transmission: Secondary | ICD-10-CM | POA: Diagnosis not present

## 2021-04-07 LAB — POC URINALSYSI DIPSTICK (AUTOMATED)
Bilirubin, UA: NEGATIVE
Glucose, UA: NEGATIVE
Ketones, UA: NEGATIVE
Leukocytes, UA: NEGATIVE
Nitrite, UA: NEGATIVE
Protein, UA: POSITIVE — AB
Spec Grav, UA: 1.02 (ref 1.010–1.025)
Urobilinogen, UA: 0.2 E.U./dL
pH, UA: 6.5 (ref 5.0–8.0)

## 2021-04-07 NOTE — Progress Notes (Signed)
Patient ID: Shannon Mcdowell, female    DOB: [birth year], 25 y.o.   MRN: [id number]  This visit was conducted in person.  BP 126/64   Pulse 81   Temp 98 F (36.7 C) (Temporal)   Ht 5\' 4"  (1.626 m)   Wt 137 lb 4 oz (62.3 kg)   LMP 04/06/2021   SpO2 98%   BMI 23.56 kg/m    CC: vaginal itching Subjective:   HPI: Shannon Mcdowell is a 25 y.o. female presenting on 04/07/2021 for Vaginal Itching (C/o vaginal itching and discharge.  Started about 1 wk ago.)   1 wk h/o vaginal itching, irritation, fishy smell. Some discharge but overall normal. Some external vaginal burning.  Ex-partner recently unfaithful - requests STD check. LMP - started yesterday.   No abd pain, fevers/chills, nausea. No dysuria, hematuria, urgency or frequency.   H/o recurrent BV and candidiasis, h/o trich     Relevant past medical, surgical, family and social history reviewed and updated as indicated. Interim medical history since our last visit reviewed. Allergies and medications reviewed and updated. Outpatient Medications Prior to Visit  Medication Sig Dispense Refill   cyclobenzaprine (FLEXERIL) 5 MG tablet Take 0.5-1 tablets (2.5-5 mg total) by mouth 2 (two) times daily as needed (migraine headache - take at onset, sedation precautions). 30 tablet 1   hydrOXYzine (ATARAX/VISTARIL) 10 MG tablet TAKE 0.5-1 TABLETS (5-10 MG TOTAL) BY MOUTH 2 (TWO) TIMES DAILY AS NEEDED FOR ANXIETY. 180 tablet 0   No facility-administered medications prior to visit.     Per HPI unless specifically indicated in ROS section below Review of Systems  Objective:  BP 126/64   Pulse 81   Temp 98 F (36.7 C) (Temporal)   Ht 5\' 4"  (1.626 m)   Wt 137 lb 4 oz (62.3 kg)   LMP 04/06/2021   SpO2 98%   BMI 23.56 kg/m   Wt Readings from Last 3 Encounters:  04/07/21 137 lb 4 oz (62.3 kg)  12/15/20 136 lb 6 oz (61.9 kg)  02/28/20 139 lb 6.4 oz (63.2 kg)      Physical Exam Vitals and nursing note reviewed. Exam conducted  with a chaperone present.  Constitutional:      Appearance: Normal appearance. She is not ill-appearing.  Genitourinary:    Exam position: Supine.     Pubic Area: No rash.      Labia:        Right: No rash.        Left: No rash.      Vagina: No vaginal discharge, tenderness, bleeding (currently on period) or lesions.     Cervix: Normal.     Comments:  Wet prep and CT/GC collected No significant bleeding Neurological:     Mental Status: She is alert.       Assessment & Plan:  This visit occurred during the SARS-CoV-2 public health emergency.  Safety protocols were in place, including screening questions prior to the visit, additional usage of staff PPE, and extensive cleaning of exam room while observing appropriate contact time as indicated for disinfecting solutions.   Problem List Items Addressed This Visit     Vaginal itching - Primary    Overall benign exam without obvious candidal infection. ?BV - await wet prep.  Check labs today including STD panel.  UA today with large blood, explained by currently on her period.       Relevant Orders   Wet prep, genital   Other Visit  Diagnoses     Screen for STD (sexually transmitted disease)       Relevant Orders   RPR   HIV Antibody (routine testing w rflx)   C. trachomatis/N. gonorrhoeae RNA        No orders of the defined types were placed in this encounter.  Orders Placed This Encounter  Procedures   C. trachomatis/N. gonorrhoeae RNA   Wet prep, genital   RPR   HIV Antibody (routine testing w rflx)      Patient Instructions  Labs today, wet prep today  We will be in touch with results.   Follow up plan: Return if symptoms worsen or fail to improve.  Ria Bush, MD

## 2021-04-07 NOTE — Addendum Note (Signed)
Addended by: Nanci Pina on: 04/07/2021 12:46 PM   Modules accepted: Orders

## 2021-04-07 NOTE — Patient Instructions (Addendum)
Labs today, wet prep today  We will be in touch with results.

## 2021-04-07 NOTE — Assessment & Plan Note (Addendum)
Overall benign exam without obvious candidal infection. ?BV - await wet prep.  Check labs today including STD panel.  UA today with large blood, explained by currently on her period.

## 2021-04-08 LAB — WET PREP BY MOLECULAR PROBE
Candida species: NOT DETECTED
Gardnerella vaginalis: NOT DETECTED
MICRO NUMBER:: 12675668
SPECIMEN QUALITY:: ADEQUATE
Trichomonas vaginosis: NOT DETECTED

## 2021-04-09 LAB — C. TRACHOMATIS/N. GONORRHOEAE RNA
C. trachomatis RNA, TMA: NOT DETECTED
N. gonorrhoeae RNA, TMA: NOT DETECTED

## 2021-04-09 LAB — HIV ANTIBODY (ROUTINE TESTING W REFLEX): HIV 1&2 Ab, 4th Generation: NONREACTIVE

## 2021-04-09 LAB — RPR: RPR Ser Ql: NONREACTIVE

## 2022-01-20 ENCOUNTER — Encounter: Payer: Self-pay | Admitting: Family Medicine

## 2022-01-28 NOTE — Telephone Encounter (Signed)
Pt called in wants to know if PCP can  respond to her message .

## 2022-02-04 NOTE — Telephone Encounter (Signed)
Placed letter at front office.  

## 2022-02-04 NOTE — Telephone Encounter (Signed)
Letter written and in Lisa's box.  

## 2022-02-04 NOTE — Telephone Encounter (Signed)
Noted.  Letter is in basket on Lisa's desk pending pt notifying me how she wants to receive it.

## 2022-03-02 ENCOUNTER — Ambulatory Visit (INDEPENDENT_AMBULATORY_CARE_PROVIDER_SITE_OTHER): Payer: Self-pay | Admitting: Family Medicine

## 2022-03-02 ENCOUNTER — Encounter: Payer: Self-pay | Admitting: Family Medicine

## 2022-03-02 VITALS — BP 118/78 | HR 113 | Temp 97.4°F | Ht 64.0 in | Wt 137.0 lb

## 2022-03-02 DIAGNOSIS — R300 Dysuria: Secondary | ICD-10-CM

## 2022-03-02 DIAGNOSIS — Z113 Encounter for screening for infections with a predominantly sexual mode of transmission: Secondary | ICD-10-CM

## 2022-03-02 DIAGNOSIS — N760 Acute vaginitis: Secondary | ICD-10-CM | POA: Insufficient documentation

## 2022-03-02 LAB — POC URINALSYSI DIPSTICK (AUTOMATED)
Bilirubin, UA: NEGATIVE
Blood, UA: NEGATIVE
Glucose, UA: NEGATIVE
Ketones, UA: NEGATIVE
Leukocytes, UA: NEGATIVE
Nitrite, UA: NEGATIVE
Protein, UA: POSITIVE — AB
Spec Grav, UA: 1.02 (ref 1.010–1.025)
Urobilinogen, UA: 0.2 E.U./dL
pH, UA: 6 (ref 5.0–8.0)

## 2022-03-02 NOTE — Assessment & Plan Note (Addendum)
Exam suspicious for this.  UA reassuring today. Wet prep and CT/GC sent off.  Will update with results and treat accordingly.  H/o recurrent BV, none in the past 1+ year.

## 2022-03-02 NOTE — Patient Instructions (Signed)
Wet prep today, also sent off chlamydia/gonorrhea testing.  We will be in touch with results.

## 2022-03-02 NOTE — Progress Notes (Signed)
Patient ID: Shannon Mcdowell, female    DOB: 1996-03-16, 26 y.o.   MRN: [id number]  This visit was conducted in person.  BP 118/78   Pulse (!) 113   Temp (!) 97.4 F (36.3 C) (Temporal)   Ht 5\' 4"  (1.626 m)   Wt 137 lb (62.1 kg)   LMP 02/13/2022   SpO2 97%   BMI 23.52 kg/m    CC: dysuria, vaginal irritation Subjective:   HPI: RHAELYN Mcdowell is a 26 y.o. female presenting on 03/02/2022 for Dysuria (C/o burning/itching after urination and foul smelling urine.  Sxs started 02/23/22.)   1 wk h/o external burning irritation after voiding, bad smell to urine. Some lower back pain and lower abdominal discomfort.  No vaginal discharge, no rash.  No fevers/chills, nausea/vomiting.  No hematuria, frequency or urgency.  She was having diarrhea, but recently it's better.   No prior UTI.  No new sexual partners. Did recently have sex with ex.  H/o recurrent BV, trich, yeast, none recently.      Relevant past medical, surgical, family and social history reviewed and updated as indicated. Interim medical history since our last visit reviewed. Allergies and medications reviewed and updated. Outpatient Medications Prior to Visit  Medication Sig Dispense Refill   cyclobenzaprine (FLEXERIL) 5 MG tablet Take 0.5-1 tablets (2.5-5 mg total) by mouth 2 (two) times daily as needed (migraine headache - take at onset, sedation precautions). 30 tablet 1   hydrOXYzine (ATARAX/VISTARIL) 10 MG tablet TAKE 0.5-1 TABLETS (5-10 MG TOTAL) BY MOUTH 2 (TWO) TIMES DAILY AS NEEDED FOR ANXIETY. 180 tablet 0   No facility-administered medications prior to visit.     Per HPI unless specifically indicated in ROS section below Review of Systems  Objective:  BP 118/78   Pulse (!) 113   Temp (!) 97.4 F (36.3 C) (Temporal)   Ht 5\' 4"  (1.626 m)   Wt 137 lb (62.1 kg)   LMP 02/13/2022   SpO2 97%   BMI 23.52 kg/m   Wt Readings from Last 3 Encounters:  03/02/22 137 lb (62.1 kg)  04/07/21 137 lb 4 oz  (62.3 kg)  12/15/20 136 lb 6 oz (61.9 kg)      Physical Exam Vitals and nursing note reviewed. Exam conducted with a chaperone present.  Constitutional:      Appearance: Normal appearance. She is not ill-appearing.  HENT:     Mouth/Throat:     Mouth: Mucous membranes are moist.     Pharynx: Oropharynx is clear. No oropharyngeal exudate or posterior oropharyngeal erythema.  Eyes:     Extraocular Movements: Extraocular movements intact.     Pupils: Pupils are equal, round, and reactive to light.  Abdominal:     General: Bowel sounds are normal. There is no distension.     Palpations: Abdomen is soft. There is no mass.     Tenderness: There is no abdominal tenderness. There is no right CVA tenderness, left CVA tenderness, guarding or rebound.     Hernia: No hernia is present.  Genitourinary:    Exam position: Supine.     Pubic Area: No rash.      Labia:        Right: Rash present. No tenderness or lesion.        Left: Rash present. No tenderness or lesion.      Vagina: Vaginal discharge (thin whitish) and erythema (mild to inner vulva) present. No bleeding or lesions.  Musculoskeletal:  Right lower leg: No edema.     Left lower leg: No edema.  Skin:    General: Skin is warm and dry.     Findings: No rash.  Neurological:     Mental Status: She is alert.  Psychiatric:        Mood and Affect: Mood normal.        Behavior: Behavior normal.       Results for orders placed or performed in visit on 03/02/22  POCT Urinalysis Dipstick (Automated)  Result Value Ref Range   Color, UA yellow    Clarity, UA clear    Glucose, UA Negative Negative   Bilirubin, UA negative    Ketones, UA negative    Spec Grav, UA 1.020 1.010 - 1.025   Blood, UA negative    pH, UA 6.0 5.0 - 8.0   Protein, UA Positive (A) Negative   Urobilinogen, UA 0.2 0.2 or 1.0 E.U./dL   Nitrite, UA negative    Leukocytes, UA Negative Negative    Assessment & Plan:   Problem List Items Addressed This  Visit     Vaginitis - Primary    Exam suspicious for this.  UA reassuring today. Wet prep and CT/GC sent off.  Will update with results and treat accordingly.  H/o recurrent BV, none in the past 1+ year.      Relevant Orders   C. trachomatis/N. gonorrhoeae RNA   WET PREP BY MOLECULAR PROBE   Other Visit Diagnoses     Dysuria       Relevant Orders   POCT Urinalysis Dipstick (Automated) (Completed)   Screen for STD (sexually transmitted disease)       Relevant Orders   C. trachomatis/N. gonorrhoeae RNA        No orders of the defined types were placed in this encounter.  Orders Placed This Encounter  Procedures   C. trachomatis/N. gonorrhoeae RNA   WET PREP BY MOLECULAR PROBE   POCT Urinalysis Dipstick (Automated)     Patient Instructions  Wet prep today, also sent off chlamydia/gonorrhea testing.  We will be in touch with results.   Follow up plan: Return if symptoms worsen or fail to improve.  Shannon Boyden, MD

## 2022-03-03 LAB — WET PREP BY MOLECULAR PROBE
Candida species: NOT DETECTED
MICRO NUMBER:: 14067928
SPECIMEN QUALITY:: ADEQUATE
Trichomonas vaginosis: NOT DETECTED

## 2022-03-03 LAB — C. TRACHOMATIS/N. GONORRHOEAE RNA
C. trachomatis RNA, TMA: NOT DETECTED
N. gonorrhoeae RNA, TMA: NOT DETECTED

## 2022-03-04 ENCOUNTER — Other Ambulatory Visit: Payer: Self-pay | Admitting: Family Medicine

## 2022-03-04 DIAGNOSIS — N760 Acute vaginitis: Secondary | ICD-10-CM

## 2022-03-04 MED ORDER — METRONIDAZOLE 0.75 % VA GEL: 1.0000 | Freq: Every day | VAGINAL | 0 refills | Status: AC

## 2022-05-03 IMAGING — MR MR MRA HEAD W/O CM
1 series · 21 of 48 positions shown · non-contrast
Comparison: Previous MRI from 10/03/2014.

CLINICAL DATA: Initial evaluation for chronic headaches, migraines.
Increasing in severity.

EXAM:
MRI HEAD WITHOUT CONTRAST
MRA HEAD WITHOUT CONTRAST
TECHNIQUE: Multiplanar, multiecho pulse sequences of the brain and surrounding
structures were obtained without intravenous contrast. Angiographic
images of the head were obtained using MRA technique without
contrast.

[Series 2: [id number] · axial · 0.7mm · 0.35mm/px · z∈[-47,+47]mm · 21 of 143 slices shown]
[im 1/143]
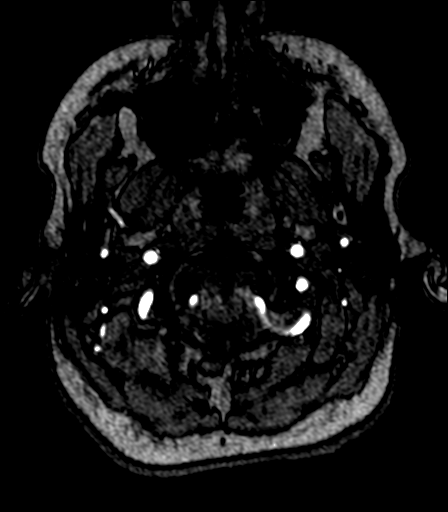
[im 4/143]
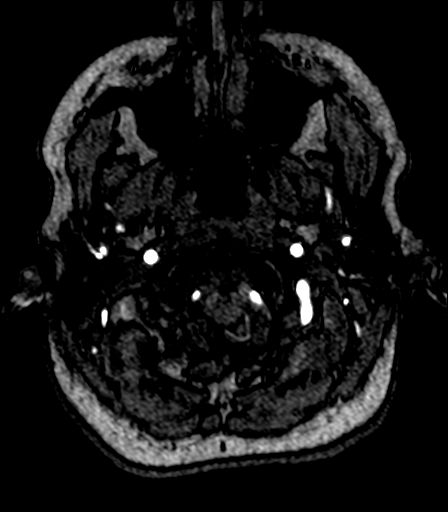
[im 7/143]
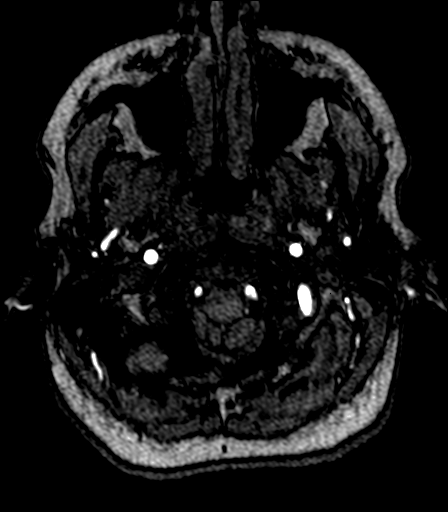
[im 10/143]
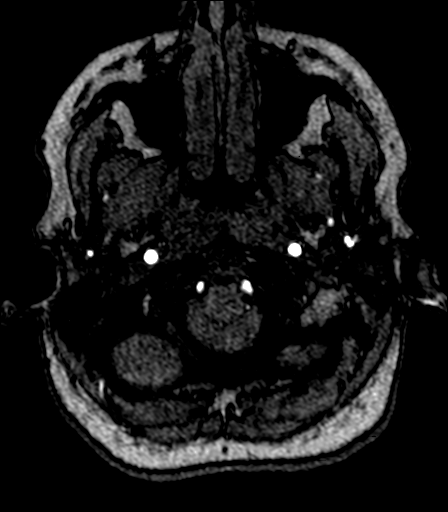
[im 13/143]
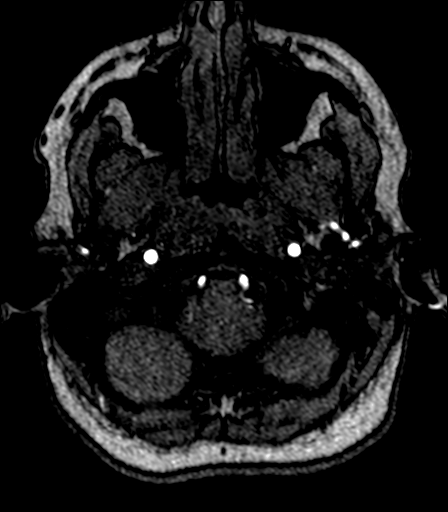
[im 16/143]
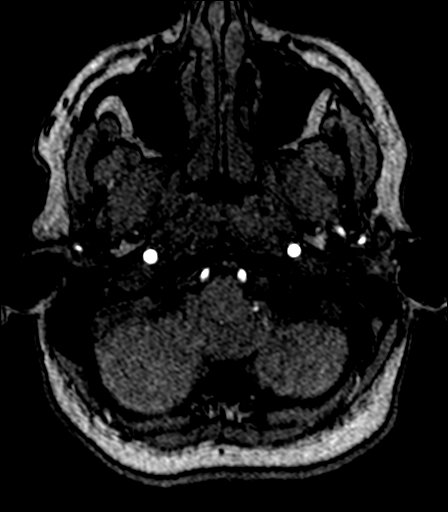
[im 19/143]
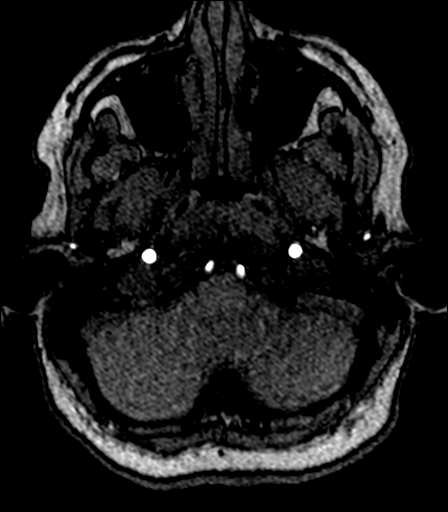
[im 22/143]
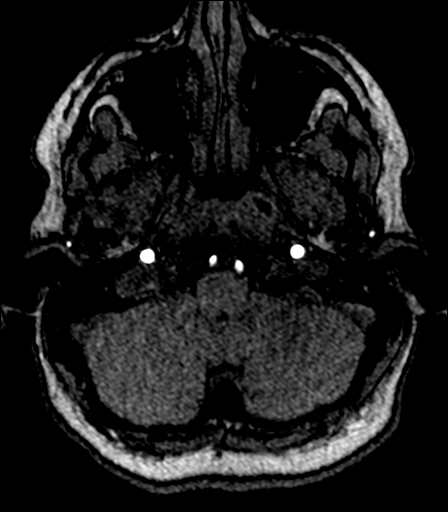
[im 25/143]
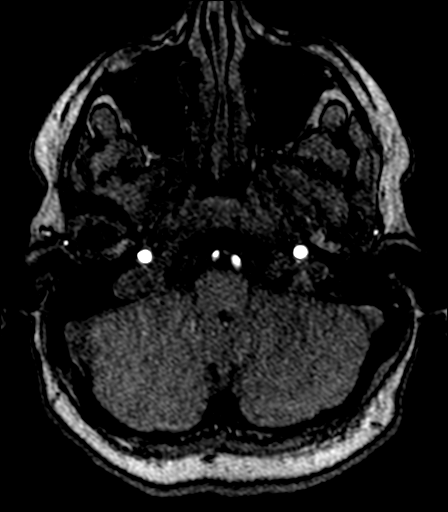
[im 28/143]
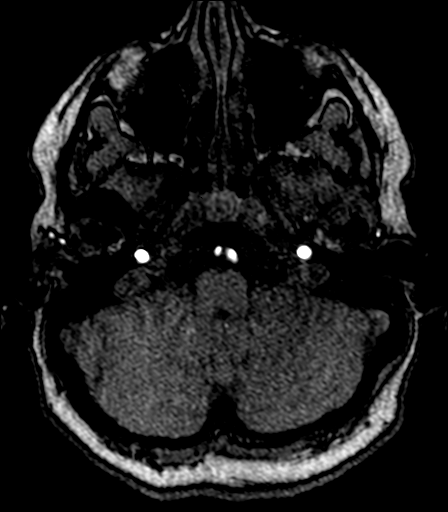
[im 31/143]
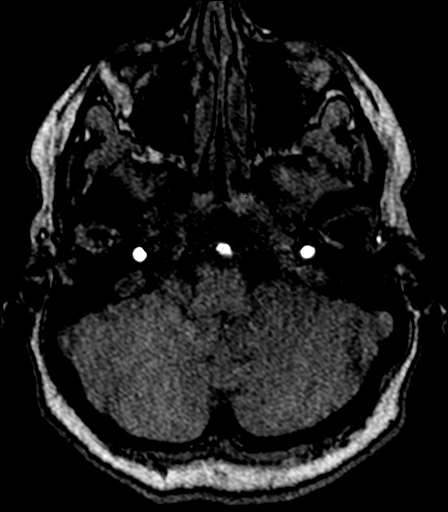
[im 34/143]
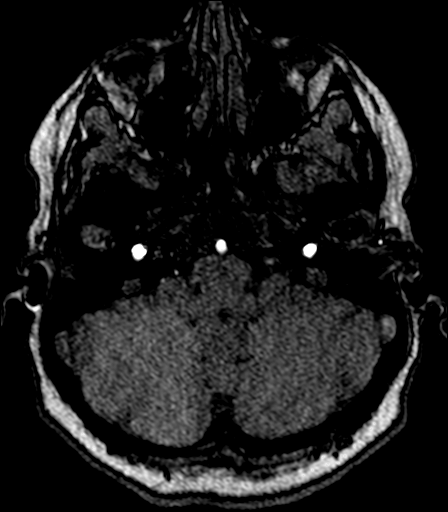
[im 37/143]
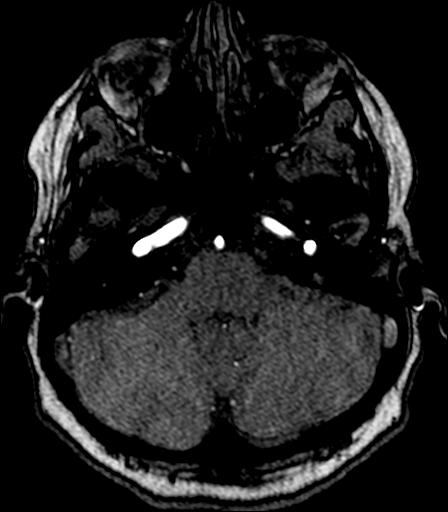
[im 46/143]
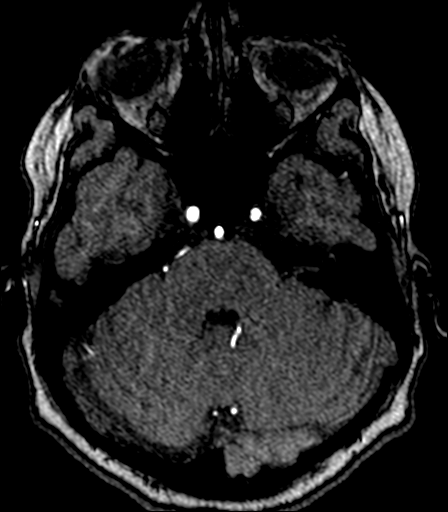
[im 64/143]
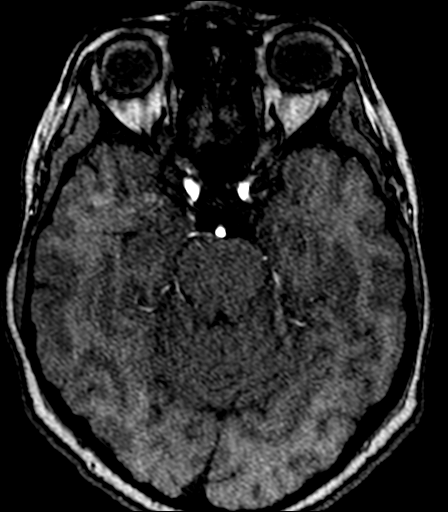
[im 73/143]
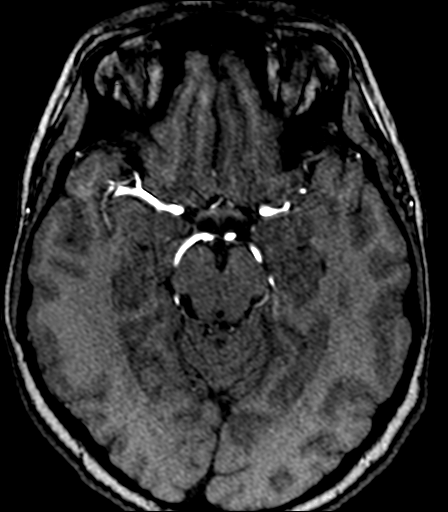
[im 82/143]
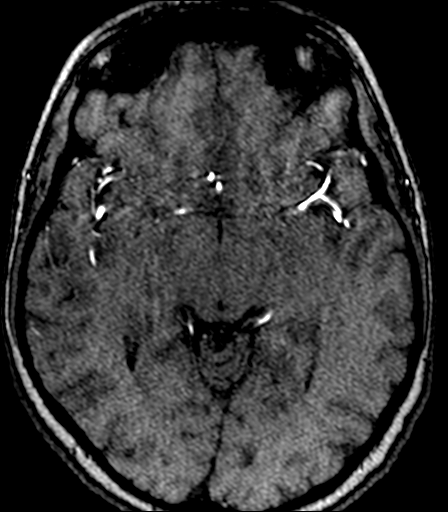
[im 100/143]
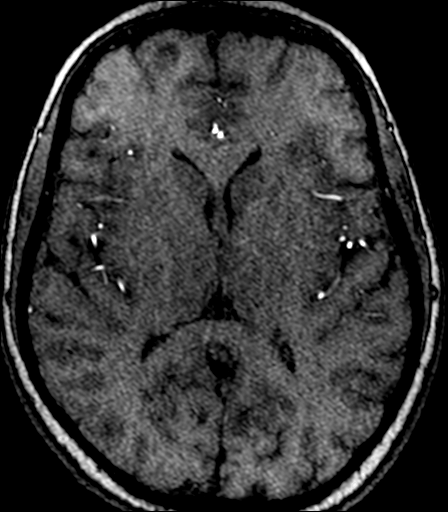
[im 118/143]
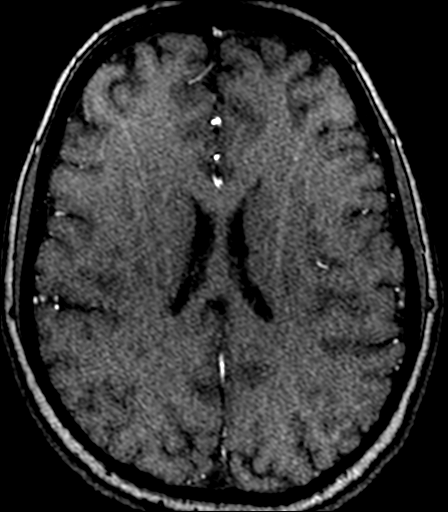
[im 121/143]
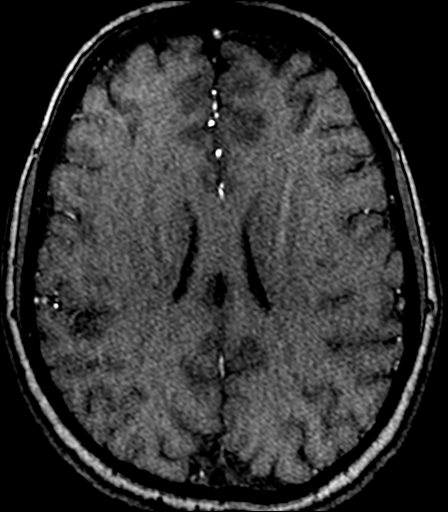
[im 136/143]
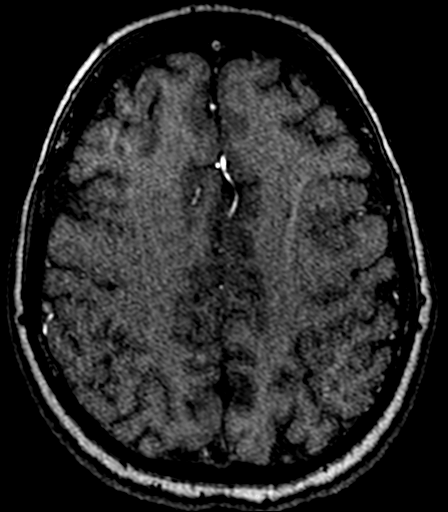

[21 of 48 positions shown; findings below may reference images not displayed]

FINDINGS: MRI HEAD FINDINGS

Brain: Cerebral volume within normal limits for age. Again seen are
2 subcentimeter foci of T2/FLAIR hyperintensity involving the
subcortical white matter of the left frontal lobe (series 6, images
22, 19). These are stable as compared to prior MRI from 1332. No
other new cerebral white matter disease or other focal parenchymal
signal abnormality.

No abnormal foci of restricted diffusion to suggest acute or
subacute ischemia. Gray-white matter differentiation maintained. No
encephalomalacia to suggest chronic cortical infarction. No foci of
susceptibility artifact to suggest acute or chronic intracranial
hemorrhage.

No mass lesion, midline shift or mass effect. Ventricles normal size
without hydrocephalus. No extra-axial fluid collection. Pituitary
gland within normal limits for age and gender. No suprasellar mass.
Midline structures intact and normal.

Vascular: Major intracranial vascular flow voids are maintained.

Skull and upper cervical spine: Mild cerebellar tonsillar ectopia of
up to 4 mm without frank Chiari malformation. Craniocervical
junction otherwise unremarkable. Visualized upper cervical spine
demonstrates no significant finding. Bone marrow signal intensity
normal. No scalp soft tissue abnormality.

Sinuses/Orbits: Globes and orbital soft tissues within normal
limits. Paranasal sinuses are clear. No significant mastoid
effusion. Inner ear structures grossly normal.

Other: None.

MRA HEAD FINDINGS

ANTERIOR CIRCULATION:

Visualized distal cervical segments of the internal carotid arteries
are widely patent with symmetric antegrade flow. Petrous, cavernous,
and supraclinoid ICAs widely patent without stenosis or other
abnormality. A1 segments patent bilaterally. Normal anterior
communicating artery complex. Anterior cerebral arteries patent to
their distal aspects without stenosis. No M1 stenosis or occlusion.
Normal MCA bifurcations. Distal MCA branches well perfused and
symmetric.

POSTERIOR CIRCULATION:

Vertebral arteries patent to the vertebrobasilar junction without
stenosis. Left vertebral artery dominant. Patent left PICA. Right
PICA not seen. Basilar patent without stenosis. Superior cerebral
arteries patent bilaterally. Both PCAs primarily supplied via the
basilar well perfused or distal aspects without stenosis. Small
right posterior communicating artery noted.

No aneurysm or other vascular abnormality.
IMPRESSION: MRI HEAD IMPRESSION:

1. Two subcentimeter foci of FLAIR hyperintensity involving the
subcortical white matter of the left frontal lobe, nonspecific, but
stable as compared to 1332. Primary consideration includes changes
of complicated migraine given provided history.
2. Mild cerebellar tonsillar ectopia without frank Chiari
malformation.
3. Otherwise unremarkable brain MRI.

MRA HEAD IMPRESSION:

Normal intracranial MRA.  No aneurysm or other vascular abnormality.
